# Patient Record
Sex: Female | Born: 1955 | Race: Black or African American | Hispanic: No | Marital: Married | Smoking: Never smoker
Health system: Southern US, Community
[De-identification: ages and names within clinical notes are randomized; demographics above are authoritative.]

## PROBLEM LIST (undated history)

## (undated) DIAGNOSIS — D689 Coagulation defect, unspecified: Secondary | ICD-10-CM

## (undated) DIAGNOSIS — Z862 Personal history of diseases of the blood and blood-forming organs and certain disorders involving the immune mechanism: Secondary | ICD-10-CM

## (undated) DIAGNOSIS — Z5189 Encounter for other specified aftercare: Secondary | ICD-10-CM

## (undated) DIAGNOSIS — E785 Hyperlipidemia, unspecified: Secondary | ICD-10-CM

## (undated) DIAGNOSIS — T7840XA Allergy, unspecified, initial encounter: Secondary | ICD-10-CM

## (undated) DIAGNOSIS — I639 Cerebral infarction, unspecified: Secondary | ICD-10-CM

## (undated) HISTORY — PX: ABDOMINAL HYSTERECTOMY: SHX81

## (undated) HISTORY — DX: Coagulation defect, unspecified: D68.9

## (undated) HISTORY — DX: Encounter for other specified aftercare: Z51.89

## (undated) HISTORY — DX: Personal history of diseases of the blood and blood-forming organs and certain disorders involving the immune mechanism: Z86.2

## (undated) HISTORY — DX: Allergy, unspecified, initial encounter: T78.40XA

## (undated) HISTORY — DX: Hyperlipidemia, unspecified: E78.5

## (undated) HISTORY — PX: LASER ABLATION OF THE CERVIX: SHX1949

---
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
## 1982-06-21 HISTORY — PX: NOSE SURGERY: SHX723

## 1983-06-22 HISTORY — PX: TUBAL LIGATION: SHX77

## 1984-06-21 DIAGNOSIS — Z862 Personal history of diseases of the blood and blood-forming organs and certain disorders involving the immune mechanism: Secondary | ICD-10-CM

## 1984-06-21 HISTORY — DX: Personal history of diseases of the blood and blood-forming organs and certain disorders involving the immune mechanism: Z86.2

## 2010-02-13 ENCOUNTER — Inpatient Hospital Stay: Payer: Self-pay | Admitting: Internal Medicine

## 2011-07-04 ENCOUNTER — Emergency Department: Payer: Self-pay | Admitting: *Deleted

## 2011-07-07 ENCOUNTER — Emergency Department: Payer: Self-pay | Admitting: Unknown Physician Specialty

## 2011-07-07 LAB — COMPREHENSIVE METABOLIC PANEL
Albumin: 4.3 g/dL (ref 3.4–5.0)
Anion Gap: 10 (ref 7–16)
BUN: 21 mg/dL — ABNORMAL HIGH (ref 7–18)
EGFR (Non-African Amer.): >60
Glucose: 80 mg/dL (ref 65–99)
Osmolality: 289 (ref 275–301)
Potassium: 4.1 mmol/L (ref 3.5–5.1)
SGPT (ALT): 27 U/L
Sodium: 144 mmol/L (ref 136–145)
Total Protein: 7.6 g/dL (ref 6.4–8.2)

## 2011-07-07 LAB — CBC
HCT: 37.2 % (ref 35.0–47.0)
MCHC: 33.8 g/dL (ref 32.0–36.0)
Platelet: 200 10*3/uL (ref 150–440)
RBC: 4.24 10*6/uL (ref 3.80–5.20)
RDW: 12 % (ref 11.5–14.5)
WBC: 2.8 10*3/uL — ABNORMAL LOW (ref 3.6–11.0)

## 2012-01-11 ENCOUNTER — Ambulatory Visit: Payer: Self-pay

## 2018-05-23 ENCOUNTER — Ambulatory Visit: Payer: Self-pay

## 2018-05-30 ENCOUNTER — Ambulatory Visit: Payer: Self-pay

## 2018-06-01 ENCOUNTER — Ambulatory Visit: Payer: Self-pay | Admitting: Adult Health Nurse Practitioner

## 2018-06-01 DIAGNOSIS — D689 Coagulation defect, unspecified: Secondary | ICD-10-CM | POA: Insufficient documentation

## 2018-06-01 DIAGNOSIS — Z0000 Encounter for general adult medical examination without abnormal findings: Secondary | ICD-10-CM | POA: Insufficient documentation

## 2018-06-01 NOTE — Progress Notes (Signed)
  Patient: Nancy ReekDesiree Murray Female    DOB: 1956/04/09   62 y.o.   MRN: [id number] Visit Date: 06/01/2018  Today's Provider: Jacelyn Pieah Doles-Johnson, NP   Chief Complaint  Patient presents with  . Health Maintenance    ITP - bruise on back of neck and shoulders   . Eye Problem    Shadows in R eye that sometimes feels like foreign body  . Abdominal Pain    Sharp pain after eating and unusual deposits in BM   Subjective:    HPI   Pt states that it has been 6-7 years since she has seen a medical provider.   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  Pt states that she has purple marks on her neck that started a year ago and has grown in size since it started.  No other episodes or abnormal bleeding or bruising.   A few days ago pt states that she ate cloves (states they help to get rid of parasites) and the pains went away. States that she thinks she may have had worms. And saw them in the toilet on BM today.  Denies any hematochezia or melena.      Allergies  Allergen Reactions  . Cefizox [Ceftizoxime] Hives and Shortness Of Breath  . Biaxin [Clarithromycin] Hives   Previous Medications   No medications on file    Review of Systems  All other systems reviewed and are negative.   Social History   Tobacco Use  . Smoking status: Never Smoker  . Smokeless tobacco: Never Used  Substance Use Topics  . Alcohol use: Not on file   Objective:   BP (!) 136/95   Pulse 61   Temp 98.4 F (36.9 C)   Ht 5' 5.35" (1.66 m)   Wt 154 lb 12.8 oz (70.2 kg)   BMI 25.48 kg/m   Physical Exam Vitals signs reviewed.  Constitutional:      Appearance: Normal appearance.  HENT:     Head: Normocephalic and atraumatic.  Eyes:     General: Lids are normal.        Right eye: No foreign body.     Extraocular Movements: Extraocular movements intact.     Conjunctiva/sclera: Conjunctivae normal.  Neck:   Musculoskeletal: Normal range of motion and neck supple.  Cardiovascular:     Rate and Rhythm: Normal rate and regular rhythm.  Pulmonary:     Effort: Pulmonary effort is normal.     Breath sounds: Normal breath sounds.  Abdominal:     General: Bowel sounds are normal.     Palpations: Abdomen is soft.  Musculoskeletal:        General: No swelling.  Skin:    General: Skin is warm and dry.  Neurological:     Mental Status: She is alert and oriented to person, place, and time.         Assessment & Plan:        Referral to eye clinic. Rinse eye with OTC eye drops.    Will test stool if BMs continue to be abnormal.   Monitor BP at next OV- if still elevated consider medications.   Routine labs ordered tonight.   ITP- check platelet count.    FU in 4 weeks for lab review.         Jacelyn Pieah Doles-Johnson, NP   [facility]

## 2018-06-02 LAB — LIPID PANEL
Chol/HDL Ratio: 3.7 ratio (ref 0.0–4.4)
Cholesterol, Total: 280 mg/dL — ABNORMAL HIGH (ref 100–199)
HDL: 75 mg/dL (ref >39)
LDL Calculated: 190 mg/dL — ABNORMAL HIGH (ref 0–99)
Triglycerides: 75 mg/dL (ref 0–149)
VLDL Cholesterol Cal: 15 mg/dL (ref 5–40)

## 2018-06-02 LAB — CBC
Hematocrit: 39.7 % (ref 34.0–46.6)
Hemoglobin: 13.3 g/dL (ref 11.1–15.9)
MCH: 29.2 pg (ref 26.6–33.0)
MCHC: 33.5 g/dL (ref 31.5–35.7)
MCV: 87 fL (ref 79–97)
Platelets: 244 10*3/uL (ref 150–450)
RBC: 4.56 x10E6/uL (ref 3.77–5.28)
RDW: 13.1 % (ref 12.3–15.4)
WBC: 4.2 10*3/uL (ref 3.4–10.8)

## 2018-06-02 LAB — COMPREHENSIVE METABOLIC PANEL
A/G RATIO: 2 (ref 1.2–2.2)
ALT: 12 IU/L (ref 0–32)
AST: 20 IU/L (ref 0–40)
Albumin: 4.9 g/dL — ABNORMAL HIGH (ref 3.6–4.8)
Alkaline Phosphatase: 115 IU/L (ref 39–117)
BILIRUBIN TOTAL: 0.4 mg/dL (ref 0.0–1.2)
BUN/Creatinine Ratio: 24 (ref 12–28)
BUN: 24 mg/dL (ref 8–27)
CHLORIDE: 100 mmol/L (ref 96–106)
CO2: 23 mmol/L (ref 20–29)
Calcium: 10.4 mg/dL — ABNORMAL HIGH (ref 8.7–10.3)
Creatinine, Ser: 1.02 mg/dL — ABNORMAL HIGH (ref 0.57–1.00)
GFR calc Af Amer: 68 mL/min/{1.73_m2} (ref >59)
GFR calc non Af Amer: 59 mL/min/{1.73_m2} — ABNORMAL LOW (ref >59)
Globulin, Total: 2.5 g/dL (ref 1.5–4.5)
Glucose: 81 mg/dL (ref 65–99)
Potassium: 4.5 mmol/L (ref 3.5–5.2)
Sodium: 140 mmol/L (ref 134–144)
Total Protein: 7.4 g/dL (ref 6.0–8.5)

## 2018-06-02 LAB — HEMOGLOBIN A1C
ESTIMATED AVERAGE GLUCOSE: 117 mg/dL
Hgb A1c MFr Bld: 5.7 % — ABNORMAL HIGH (ref 4.8–5.6)

## 2018-06-02 LAB — TSH: TSH: 0.958 u[IU]/mL (ref 0.450–4.500)

## 2018-07-04 ENCOUNTER — Ambulatory Visit: Payer: Self-pay | Admitting: Family Medicine

## 2018-07-04 VITALS — BP 117/85 | HR 57 | Temp 98.0°F | Ht 65.0 in | Wt 154.1 lb

## 2018-07-04 DIAGNOSIS — E785 Hyperlipidemia, unspecified: Secondary | ICD-10-CM

## 2018-07-04 DIAGNOSIS — R238 Other skin changes: Principal | ICD-10-CM

## 2018-07-04 DIAGNOSIS — M79605 Pain in left leg: Secondary | ICD-10-CM

## 2018-07-04 DIAGNOSIS — R7303 Prediabetes: Secondary | ICD-10-CM

## 2018-07-04 DIAGNOSIS — E782 Mixed hyperlipidemia: Secondary | ICD-10-CM

## 2018-07-04 DIAGNOSIS — Z1231 Encounter for screening mammogram for malignant neoplasm of breast: Secondary | ICD-10-CM

## 2018-07-04 DIAGNOSIS — D699 Hemorrhagic condition, unspecified: Secondary | ICD-10-CM

## 2018-07-04 HISTORY — DX: Hyperlipidemia, unspecified: E78.5

## 2018-07-04 NOTE — Assessment & Plan Note (Signed)
Barely in the prediabetes range; avoid whites (flour, rice, sugar, etc); monitor every 6 months

## 2018-07-04 NOTE — Progress Notes (Signed)
BP 117/85 (BP Location: Left Arm, Patient Position: Sitting, Cuff Size: Normal)   Pulse (!) 57   Temp 98 F (36.7 C) (Oral)   Ht _0  (1.651 m)   Wt 154 lb 1.6 oz (69.9 kg)   LMP 07/04/2005 (Within Months)   BMI 25.64 kg/m    Subjective:    Patient ID: Nancy Murray, female    DOB: 1956/06/07, 63 y.o.   MRN: [id number]  HPI: Nancy Murray is a 63 y.o. female  Chief Complaint  Patient presents with  . Follow-up    No new health concerns; concerned about hands/feet being really cold for extended period after being outside    HPI Here for regular f/u No medical excitement  When she is outside, hands and feet get icy cold when she was being outside for a while No change in color No pain She does have a hx of anemia; no extra fatigue Sister had hypothyroidism; obese, diabetes, HTN No significant weight gain, back and forth between 10 pounds No constipation now  Hx of ITP; she hasn't seen a hematologist for many years; over 15 years ago; everything was fine for the most part she says; they checked her platelets and they were low and started her on prednisone, 15 years ago; hospitalized for a few weeks; took prednisone for 6-8 weeks; not sure if any DEXA  She was referred to the eye clinic  Mildly reduced GFR, no fam hx of kidney disease in the family; good water drinker  High cholesterol; LDL was 190 last month; mother had heart failure; maternal uncle had a stroke High before, responded to weight loss; patient is not interested in statins; she is wondering what she can eat  The 10-year ASCVD risk score Mikey Bussing DC Brooke Bonito., et al., 2013) is: 6.5%   Values used to calculate the score:     Age: 13 years     Sex: Female     Is Non-Hispanic African American: Yes     Diabetic: No     Tobacco smoker: No     Systolic Blood Pressure: 909 mmHg     Is BP treated: No     HDL Cholesterol: 75 mg/dL     Total Cholesterol: 280 mg/dL  Prediabetes; drink herbal tea; not drinking  sodas  Was carrying heavy pocketbook over shoulders; darker pigmentation versus bruising on the shoulder; no soreness; no nosebleeds or gum bleeding; no abnormal bleeding in urine or stool; going on for 10 months  Pains in the legs, numbness, then that went away; wonders about tick bites  No flowsheet data found. No flowsheet data found.  Relevant past medical, surgical, family and social history reviewed Past Medical History:  Diagnosis Date  . Allergy   . Blood transfusion without reported diagnosis   . Clotting disorder ([place])   . History of ITP 1986  . Hyperlipidemia 07/04/2018   Past Surgical History:  Procedure Laterality Date  . ABDOMINAL HYSTERECTOMY    . CESAREAN SECTION    . LASER ABLATION OF THE CERVIX    . NOSE SURGERY     No family history on file. Social History   Tobacco Use  . Smoking status: Never Smoker  . Smokeless tobacco: Never Used  Substance Use Topics  . Alcohol use: Not on file  . Drug use: Not on file     Interim medical history since last visit reviewed. Allergies and medications reviewed  Review of Systems Per HPI unless specifically indicated above  Objective:    BP 117/85 (BP Location: Left Arm, Patient Position: Sitting, Cuff Size: Normal)   Pulse (!) 57   Temp 98 F (36.7 C) (Oral)   Ht _0  (1.651 m)   Wt 154 lb 1.6 oz (69.9 kg)   LMP 07/04/2005 (Within Months)   BMI 25.64 kg/m   Wt Readings from Last 3 Encounters:  07/04/18 154 lb 1.6 oz (69.9 kg)  06/01/18 154 lb 12.8 oz (70.2 kg)    Physical Exam Constitutional:      General: She is not in acute distress.    Appearance: She is well-developed. She is not diaphoretic.  HENT:     Head: Normocephalic and atraumatic.  Eyes:     General: No scleral icterus. Neck:     Thyroid: No thyromegaly.  Cardiovascular:     Rate and Rhythm: Normal rate and regular rhythm.     Heart sounds: Normal heart sounds. No murmur.  Pulmonary:     Effort: Pulmonary effort is normal. No  respiratory distress.     Breath sounds: Normal breath sounds. No wheezing.  Abdominal:     General: There is no distension.  Skin:    General: Skin is warm and dry.     Coloration: Skin is not pale.          Comments: Hyperpigmentation versus ecchymotic changes over both traps and nape of neck; no thickening or velvety texture typical of acanthosis nigricans  Neurological:     Mental Status: She is alert.  Psychiatric:        Mood and Affect: Mood is not anxious or depressed.        Behavior: Behavior normal.        Thought Content: Thought content normal.        Judgment: Judgment normal.     Results for orders placed or performed in visit on 06/01/18  CBC  Result Value Ref Range   WBC 4.2 3.4 - 10.8 x10E3/uL   RBC 4.56 3.77 - 5.28 x10E6/uL   Hemoglobin 13.3 11.1 - 15.9 g/dL   Hematocrit 39.7 34.0 - 46.6 %   MCV 87 79 - 97 fL   MCH 29.2 26.6 - 33.0 pg   MCHC 33.5 31.5 - 35.7 g/dL   RDW 13.1 12.3 - 15.4 %   Platelets 244 150 - 450 x10E3/uL  Comp Met (CMET)  Result Value Ref Range   Glucose 81 65 - 99 mg/dL   BUN 24 8 - 27 mg/dL   Creatinine, Ser 1.02 (H) 0.57 - 1.00 mg/dL   GFR calc non Af Amer 59 (L) >59 mL/min/1.73   GFR calc Af Amer 68 >59 mL/min/1.73   BUN/Creatinine Ratio 24 12 - 28   Sodium 140 134 - 144 mmol/L   Potassium 4.5 3.5 - 5.2 mmol/L   Chloride 100 96 - 106 mmol/L   CO2 23 20 - 29 mmol/L   Calcium 10.4 (H) 8.7 - 10.3 mg/dL   Total Protein 7.4 6.0 - 8.5 g/dL   Albumin 4.9 (H) 3.6 - 4.8 g/dL   Globulin, Total 2.5 1.5 - 4.5 g/dL   Albumin/Globulin Ratio 2.0 1.2 - 2.2   Bilirubin Total 0.4 0.0 - 1.2 mg/dL   Alkaline Phosphatase 115 39 - 117 IU/L   AST 20 0 - 40 IU/L   ALT 12 0 - 32 IU/L  Lipid Profile  Result Value Ref Range   Cholesterol, Total 280 (H) 100 - 199 mg/dL   Triglycerides 75 0 -  149 mg/dL   HDL 75 >39 mg/dL   VLDL Cholesterol Cal 15 5 - 40 mg/dL   LDL Calculated 190 (H) 0 - 99 mg/dL   Comment: Comment    Chol/HDL Ratio 3.7 0.0 -  4.4 ratio  HgB A1c  Result Value Ref Range   Hgb A1c MFr Bld 5.7 (H) 4.8 - 5.6 %   Est. average glucose Bld gHb Est-mCnc 117 mg/dL  TSH  Result Value Ref Range   TSH 0.958 0.450 - 4.500 uIU/mL      Assessment & Plan:   Problem List Items Addressed This Visit      Other   Prediabetes    Barely in the prediabetes range; avoid whites (flour, rice, sugar, etc); monitor every 6 months      Hyperlipidemia    Risk of heart attack and stroke, 10 year risk is 6.5%; patient does not want a statin but would like to try diet first; avoid saturated fats, more plant-based and whole grains; recheck lipids in 8 weeks       Other Visit Diagnoses    Other skin changes    -  Primary   Relevant Orders   Ambulatory referral to Dermatology   B. burgdorfi antibodies   ANA,IFA RA Diag Pnl w/rflx Tit/Patn   Pain of left lower extremity       Relevant Orders   B. burgdorfi antibodies   ANA,IFA RA Diag Pnl w/rflx Tit/Patn   Bleeding disorder ([place])       Relevant Orders   PTT   Protime-INR   Breast cancer screening by mammogram       Relevant Orders   MM 3D SCREEN BREAST BILATERAL       Follow up plan: Return in about 8 weeks (around 08/29/2018) for labs, then provider 1-2 weeks later.  An after-visit summary was printed and given to the patient at [place].  Please see the patient instructions which may contain other information and recommendations beyond what is mentioned above in the assessment and plan.  No orders of the defined types were placed in this encounter.   Orders Placed This Encounter  Procedures  . MM 3D SCREEN BREAST BILATERAL  . PTT  . Protime-INR  . B. burgdorfi antibodies  . ANA,IFA RA Diag Pnl w/rflx Tit/Patn  . Ambulatory referral to Dermatology

## 2018-07-04 NOTE — Patient Instructions (Addendum)
Try to limit saturated fats in your diet (bologna, hot dogs, barbeque, cheeseburgers, hamburgers, steak, bacon, sausage, cheese, etc.) and get more fresh fruits, vegetables, and whole grains  Return for labs in 8 weeks and then follow-up with provider afterwards to discuss   Cholesterol Cholesterol is a white, waxy, fat-like substance that is needed by the human body in small amounts. The liver makes all the cholesterol we need. Cholesterol is carried from the liver by the blood through the blood vessels. Deposits of cholesterol (plaques) may build up on blood vessel (artery) walls. Plaques make the arteries narrower and stiffer. Cholesterol plaques increase the risk for heart attack and stroke. You cannot feel your cholesterol level even if it is very high. The only way to know that it is high is to have a blood test. Once you know your cholesterol levels, you should keep a record of the test results. Work with your health care provider to keep your levels in the desired range. What do the results mean?  Total cholesterol is a rough measure of all the cholesterol in your blood.  LDL (low-density lipoprotein) is the "bad" cholesterol. This is the type that causes plaque to build up on the artery walls. You want this level to be low.  HDL (high-density lipoprotein) is the "good" cholesterol because it cleans the arteries and carries the LDL away. You want this level to be high.  Triglycerides are fat that the body can either burn for energy or store. High levels are closely linked to heart disease. What are the desired levels of cholesterol?  Total cholesterol below 200.  LDL below 100 for people who are at risk, below 70 for people at very high risk.  HDL above 40 is good. A level of 60 or higher is considered to be protective against heart disease.  Triglycerides below 150. How can I lower my cholesterol? Diet Follow your diet program as told by your health care provider.  Choose  fish or white meat chicken and [place], roasted or baked. Limit fatty cuts of red meat, fried foods, and processed meats, such as sausage and lunch meats.  Eat lots of fresh fruits and vegetables.  Choose whole grains, beans, pasta, potatoes, and cereals.  Choose olive oil, corn oil, or canola oil, and use only small amounts.  Avoid butter, mayonnaise, shortening, or palm kernel oils.  Avoid foods with trans fats.  Drink skim or nonfat milk and eat low-fat or nonfat yogurt and cheeses. Avoid whole milk, cream, ice cream, egg yolks, and full-fat cheeses.  Healthier desserts include angel food cake, ginger snaps, animal crackers, hard candy, popsicles, and low-fat or nonfat frozen yogurt. Avoid pastries, cakes, pies, and cookies.  Exercise  Follow your exercise program as told by your health care provider. A regular program: ? Helps to decrease LDL and raise HDL. ? Helps with weight control.  Do things that increase your activity level, such as gardening, walking, and taking the stairs.  Ask your health care provider about ways that you can be more active in your daily life. Medicine  Take over-the-counter and prescription medicines only as told by your health care provider. ? Medicine may be prescribed by your health care provider to help lower cholesterol and decrease the risk for heart disease. This is usually done if diet and exercise have failed to bring down cholesterol levels. ? If you have several risk factors, you may need medicine even if your levels are normal. This information is not intended  to replace advice given to you by your health care provider. Make sure you discuss any questions you have with your health care provider. Document Released: 03/02/2001 Document Revised: 01/03/2016 Document Reviewed: 12/06/2015 Elsevier Interactive Patient Education  2019 Elsevier Inc.  Preventing High Cholesterol Cholesterol is a waxy, fat-like substance that your body needs in small  amounts. Your liver makes all the cholesterol that your body needs. Having high cholesterol (hypercholesterolemia) increases your risk for heart disease and stroke. Extra (excess) cholesterol comes from the food you eat, such as animal-based fat (saturated fat) from meat and some dairy products. High cholesterol can often be prevented with diet and lifestyle changes. If you already have high cholesterol, you can control it with diet and lifestyle changes, as well as medicine. What nutrition changes can be made?  Eat less saturated fat. Foods that contain saturated fat include red meat and some dairy products.  Avoid processed meats, like bacon and lunch meats.  Avoid trans fats, which are found in margarine and some baked goods.  Avoid foods and beverages that have added sugars.  Eat more fruits, vegetables, and whole grains.  Choose healthy sources of protein, such as fish, poultry, and nuts.  Choose healthy sources of fat, such as: ? Nuts. ? Vegetable oils, especially olive oil. ? Fish that have healthy fats (omega-3 fatty acids), such as mackerel or salmon. What lifestyle changes can be made?   Lose weight if you are overweight. Losing 5-10 lb (2.3-4.5 kg) can help prevent or control high cholesterol and reduce your risk for diabetes and high blood pressure. Ask your health care provider to help you with a diet and exercise plan to safely lose weight.  Get enough exercise. Do at least 150 minutes of moderate-intensity exercise each week. ? You could do this in short exercise sessions several times a day, or you could do longer exercise sessions a few times a week. For example, you could take a brisk 10-minute walk or bike ride, 3 times a day, for 5 days a week.  Do not smoke. If you need help quitting, ask your health care provider.  Limit your alcohol intake. If you drink alcohol, limit alcohol intake to no more than 1 drink a day for nonpregnant women and 2 drinks a day for men.  One drink equals 12 oz of beer, 5 oz of wine, or 1 oz of hard liquor. Why are these changes important?  If you have high cholesterol, deposits (plaques) may build up on the walls of your blood vessels. Plaques make the arteries narrower and stiffer, which can restrict or block blood flow and cause blood clots to form. This greatly increases your risk for heart attack and stroke. Making diet and lifestyle changes can reduce your risk for these life-threatening conditions. What can I do to lower my risk?  Manage your risk factors for high cholesterol. Talk with your health care provider about all of your risk factors and how to lower your risk.  Manage other conditions that you have, such as diabetes or high blood pressure (hypertension).  Have your cholesterol checked at regular intervals.  Keep all follow-up visits as told by your health care provider. This is important. How is this treated? In addition to diet and lifestyle changes, your health care provider may recommend medicines to help lower cholesterol, such as a medicine to reduce the amount of cholesterol made in your liver. You may need medicine if:  Diet and lifestyle changes do not lower your  cholesterol enough.  You have high cholesterol and other risk factors for heart disease or stroke. Take over-the-counter and prescription medicines only as told by your health care provider. Where to find more information  American Heart Association: 1122334455www.heart.org/HEARTORG/Conditions/Cholesterol/Cholesterol_UCM_001089_SubHomePage.jsp  National Heart, Lung, and Blood Institute: http://hood.com/www.nhlbi.nih.gov/health/resources/heart/heart-cholesterol-hbc-what-html Summary  High cholesterol increases your risk for heart disease and stroke. By keeping your cholesterol level low, you can reduce your risk for these conditions.  Diet and lifestyle changes are the most important steps in preventing high cholesterol.  Work with your health care provider to  manage your risk factors, and have your blood tested regularly. This information is not intended to replace advice given to you by your health care provider. Make sure you discuss any questions you have with your health care provider. Document Released: 06/22/2015 Document Revised: 02/14/2016 Document Reviewed: 02/14/2016 Elsevier Interactive Patient Education  2019 Elsevier Inc.  Prediabetes Eating Plan Prediabetes is a condition that causes blood sugar (glucose) levels to be higher than normal. This increases the risk for developing diabetes. In order to prevent diabetes from developing, your health care provider may recommend a diet and other lifestyle changes to help you:  Control your blood glucose levels.  Improve your cholesterol levels.  Manage your blood pressure. Your health care provider may recommend working with a diet and nutrition specialist (dietitian) to make a meal plan that is best for you. What are tips for following this plan? Lifestyle  Set weight loss goals with the help of your health care team. It is recommended that most people with prediabetes lose 7% of their current body weight.  Exercise for at least 30 minutes at least 5 days a week.  Attend a support group or seek ongoing support from a mental health counselor.  Take over-the-counter and prescription medicines only as told by your health care provider. Reading food labels  Read food labels to check the amount of fat, salt (sodium), and sugar in prepackaged foods. Avoid foods that have: ? Saturated fats. ? Trans fats. ? Added sugars.  Avoid foods that have more than 300 milligrams (mg) of sodium per serving. Limit your daily sodium intake to less than 2,300 mg each day. Shopping  Avoid buying pre-made and processed foods. Cooking  Cook with olive oil. Do not use butter, lard, or ghee.  Bake, broil, grill, or boil foods. Avoid frying. Meal planning   Work with your dietitian to develop an  eating plan that is right for you. This may include: ? Tracking how many calories you take in. Use a food diary, notebook, or mobile application to track what you eat at each meal. ? Using the glycemic index (GI) to plan your meals. The index tells you how quickly a food will raise your blood glucose. Choose low-GI foods. These foods take a longer time to raise blood glucose.  Consider following a Mediterranean diet. This diet includes: ? Several servings each day of fresh fruits and vegetables. ? Eating fish at least twice a week. ? Several servings each day of whole grains, beans, nuts, and seeds. ? Using olive oil instead of other fats. ? Moderate alcohol consumption. ? Eating small amounts of red meat and whole-fat dairy.  If you have high blood pressure, you may need to limit your sodium intake or follow a diet such as the DASH eating plan. DASH is an eating plan that aims to lower high blood pressure. What foods are recommended? The items listed below may not be a complete list.  Talk with your dietitian about what dietary choices are best for you. Grains Whole grains, such as whole-wheat or whole-grain breads, crackers, cereals, and pasta. Unsweetened oatmeal. Bulgur. Barley. Quinoa. Brown rice. Corn or whole-wheat flour tortillas or taco shells. Vegetables Lettuce. Spinach. Peas. Beets. Cauliflower. Cabbage. Broccoli. Carrots. Tomatoes. Squash. Eggplant. Herbs. Peppers. Onions. Cucumbers. Brussels sprouts. Fruits Berries. Bananas. Apples. Oranges. Grapes. Papaya. Mango. Pomegranate. Kiwi. Grapefruit. Cherries. Meats and other protein foods Seafood. Poultry without skin. Lean cuts of pork and beef. Tofu. Eggs. Nuts. Beans. Dairy Low-fat or fat-free dairy products, such as yogurt, cottage cheese, and cheese. Beverages Water. Tea. Coffee. Sugar-free or diet soda. Seltzer water. Lowfat or no-fat milk. Milk alternatives, such as soy or almond milk. Fats and oils Olive oil. Canola oil.  Sunflower oil. Grapeseed oil. Avocado. Walnuts. Sweets and desserts Sugar-free or low-fat pudding. Sugar-free or low-fat ice cream and other frozen treats. Seasoning and other foods Herbs. Sodium-free spices. Mustard. Relish. Low-fat, low-sugar ketchup. Low-fat, low-sugar barbecue sauce. Low-fat or fat-free mayonnaise. What foods are not recommended? The items listed below may not be a complete list. Talk with your dietitian about what dietary choices are best for you. Grains Refined white flour and flour products, such as bread, pasta, snack foods, and cereals. Vegetables Canned vegetables. Frozen vegetables with butter or cream sauce. Fruits Fruits canned with syrup. Meats and other protein foods Fatty cuts of meat. Poultry with skin. Breaded or fried meat. Processed meats. Dairy Full-fat yogurt, cheese, or milk. Beverages Sweetened drinks, such as sweet iced tea and soda. Fats and oils Butter. Lard. Ghee. Sweets and desserts Baked goods, such as cake, cupcakes, pastries, cookies, and cheesecake. Seasoning and other foods Spice mixes with added salt. Ketchup. Barbecue sauce. Mayonnaise. Summary  To prevent diabetes from developing, you may need to make diet and other lifestyle changes to help control blood sugar, improve cholesterol levels, and manage your blood pressure.  Set weight loss goals with the help of your health care team. It is recommended that most people with prediabetes lose 7 percent of their current body weight.  Consider following a Mediterranean diet that includes plenty of fresh fruits and vegetables, whole grains, beans, nuts, seeds, fish, lean meat, low-fat dairy, and healthy oils. This information is not intended to replace advice given to you by your health care provider. Make sure you discuss any questions you have with your health care provider. Document Released: 10/22/2014 Document Revised: 08/11/2016 Document Reviewed: 08/11/2016 Elsevier Interactive  Patient Education  2019 [employer].

## 2018-07-04 NOTE — Assessment & Plan Note (Signed)
Risk of heart attack and stroke, 10 year risk is 6.5%; patient does not want a statin but would like to try diet first; avoid saturated fats, more plant-based and whole grains; recheck lipids in 8 weeks

## 2018-07-06 ENCOUNTER — Ambulatory Visit: Payer: Self-pay | Admitting: Ophthalmology

## 2018-07-06 LAB — B. BURGDORFI ANTIBODIES: Lyme IgG/IgM Ab: <0.91 {ISR} (ref 0.00–0.90)

## 2018-07-06 LAB — PROTIME-INR
INR: 1.1 (ref 0.8–1.2)
Prothrombin Time: 11.2 s (ref 9.1–12.0)

## 2018-07-06 LAB — APTT: APTT: 28 s (ref 24–33)

## 2018-07-06 LAB — ANA,IFA RA DIAG PNL W/RFLX TIT/PATN
ANA Titer 1: NEGATIVE
Cyclic Citrullin Peptide Ab: 6 units (ref 0–19)
Rheumatoid fact SerPl-aCnc: <10 IU/mL (ref 0.0–13.9)

## 2018-08-29 ENCOUNTER — Other Ambulatory Visit: Payer: Self-pay

## 2018-08-29 DIAGNOSIS — E782 Mixed hyperlipidemia: Secondary | ICD-10-CM

## 2018-08-30 LAB — COMPREHENSIVE METABOLIC PANEL
ALT: 18 IU/L (ref 0–32)
AST: 19 IU/L (ref 0–40)
Albumin/Globulin Ratio: 2 (ref 1.2–2.2)
Albumin: 4.4 g/dL (ref 3.8–4.8)
Alkaline Phosphatase: 112 IU/L (ref 39–117)
BUN / CREAT RATIO: 26 (ref 12–28)
BUN: 25 mg/dL (ref 8–27)
Bilirubin Total: 0.2 mg/dL (ref 0.0–1.2)
CALCIUM: 9.8 mg/dL (ref 8.7–10.3)
CO2: 22 mmol/L (ref 20–29)
Chloride: 102 mmol/L (ref 96–106)
Creatinine, Ser: 0.96 mg/dL (ref 0.57–1.00)
GFR calc Af Amer: 73 mL/min/{1.73_m2} (ref >59)
GFR calc non Af Amer: 63 mL/min/{1.73_m2} (ref >59)
GLOBULIN, TOTAL: 2.2 g/dL (ref 1.5–4.5)
Glucose: 95 mg/dL (ref 65–99)
Potassium: 4.3 mmol/L (ref 3.5–5.2)
Sodium: 138 mmol/L (ref 134–144)
Total Protein: 6.6 g/dL (ref 6.0–8.5)

## 2018-08-30 LAB — HEMOGLOBIN A1C
Est. average glucose Bld gHb Est-mCnc: 120 mg/dL
Hgb A1c MFr Bld: 5.8 % — ABNORMAL HIGH (ref 4.8–5.6)

## 2018-09-05 ENCOUNTER — Ambulatory Visit: Payer: Self-pay

## 2018-09-06 ENCOUNTER — Encounter (INDEPENDENT_AMBULATORY_CARE_PROVIDER_SITE_OTHER): Payer: Self-pay

## 2018-09-07 ENCOUNTER — Other Ambulatory Visit: Payer: Self-pay

## 2018-09-07 ENCOUNTER — Ambulatory Visit: Payer: Self-pay | Admitting: Adult Health Nurse Practitioner

## 2018-09-07 VITALS — BP 122/88 | HR 66 | Temp 98.1°F | Ht 61.42 in | Wt 155.9 lb

## 2018-09-07 DIAGNOSIS — E782 Mixed hyperlipidemia: Secondary | ICD-10-CM

## 2018-09-07 DIAGNOSIS — R7303 Prediabetes: Secondary | ICD-10-CM

## 2018-09-07 NOTE — Progress Notes (Signed)
  Patient: Nancy Murray Female    DOB: Sep 01, 1955   63 y.o.   MRN: [id number] Visit Date: 09/07/2018  Today's Provider: [facility]   Chief Complaint  Patient presents with  . Follow-up    lab results    Subjective:    HPI   Reviewed lab results.   Allergies  Allergen Reactions  . Cefizox [Ceftizoxime] Hives and Shortness Of Breath  . Biaxin [Clarithromycin] Hives   Previous Medications   No medications on file    Review of Systems  Social History   Tobacco Use  . Smoking status: Never Smoker  . Smokeless tobacco: Never Used  Substance Use Topics  . Alcohol use: Not on file   Objective:   BP 122/88 (BP Location: Left Arm, Patient Position: Sitting, Cuff Size: Normal)   Pulse 66   Temp 98.1 F (36.7 C)   Ht 5' 1.42" (1.56 m)   Wt 155 lb 14.4 oz (70.7 kg)   LMP 07/04/2005 (Within Months)   BMI 29.06 kg/m   Physical Exam Vitals signs reviewed.  Constitutional:      Appearance: Normal appearance.  Cardiovascular:     Rate and Rhythm: Normal rate and regular rhythm.     Heart sounds: Normal heart sounds.  Pulmonary:     Effort: Pulmonary effort is normal.     Breath sounds: Normal breath sounds.  Abdominal:     General: Bowel sounds are normal.     Palpations: Abdomen is soft.  Neurological:     Mental Status: She is alert.         Assessment & Plan:         PreDM/HLD:  Encouraged healthy lifestyle modifications.   Labs reviewed.  FU in 6 months.   [facility]   [facility]

## 2019-03-01 ENCOUNTER — Other Ambulatory Visit: Payer: Self-pay

## 2019-03-01 DIAGNOSIS — E782 Mixed hyperlipidemia: Secondary | ICD-10-CM

## 2019-03-01 DIAGNOSIS — R7303 Prediabetes: Secondary | ICD-10-CM

## 2019-03-02 LAB — BASIC METABOLIC PANEL
BUN/Creatinine Ratio: 22 (ref 12–28)
BUN: 21 mg/dL (ref 8–27)
CO2: 24 mmol/L (ref 20–29)
Calcium: 9.9 mg/dL (ref 8.7–10.3)
Chloride: 101 mmol/L (ref 96–106)
Creatinine, Ser: 0.97 mg/dL (ref 0.57–1.00)
GFR calc Af Amer: 71 mL/min/{1.73_m2} (ref >59)
GFR calc non Af Amer: 62 mL/min/{1.73_m2} (ref >59)
Glucose: 80 mg/dL (ref 65–99)
Potassium: 4.5 mmol/L (ref 3.5–5.2)
Sodium: 136 mmol/L (ref 134–144)

## 2019-03-02 LAB — LIPID PANEL
Chol/HDL Ratio: 4 ratio (ref 0.0–4.4)
Cholesterol, Total: 255 mg/dL — ABNORMAL HIGH (ref 100–199)
HDL: 64 mg/dL (ref >39)
LDL Chol Calc (NIH): 176 mg/dL — ABNORMAL HIGH (ref 0–99)
Triglycerides: 87 mg/dL (ref 0–149)
VLDL Cholesterol Cal: 15 mg/dL (ref 5–40)

## 2019-03-02 LAB — HEMOGLOBIN A1C
Est. average glucose Bld gHb Est-mCnc: 114 mg/dL
Hgb A1c MFr Bld: 5.6 % (ref 4.8–5.6)

## 2019-03-08 ENCOUNTER — Other Ambulatory Visit: Payer: Self-pay

## 2019-03-08 ENCOUNTER — Ambulatory Visit: Payer: Self-pay | Admitting: Gerontology

## 2019-03-15 ENCOUNTER — Other Ambulatory Visit: Payer: Self-pay | Admitting: Gerontology

## 2019-03-15 DIAGNOSIS — E782 Mixed hyperlipidemia: Secondary | ICD-10-CM

## 2019-03-18 ENCOUNTER — Inpatient Hospital Stay
Admission: EM | Admit: 2019-03-18 | Discharge: 2019-03-19 | DRG: 064 | Disposition: A | Discharge status: Home / Self Care | Payer: Self-pay | Attending: Internal Medicine | Admitting: Internal Medicine

## 2019-03-18 ENCOUNTER — Other Ambulatory Visit: Payer: Self-pay

## 2019-03-18 ENCOUNTER — Emergency Department: Payer: Self-pay

## 2019-03-18 ENCOUNTER — Inpatient Hospital Stay (HOSPITAL_COMMUNITY)
Admit: 2019-03-18 | Discharge: 2019-03-18 | Disposition: A | Discharge status: Home / Self Care | Payer: Self-pay | Attending: Internal Medicine | Admitting: Internal Medicine

## 2019-03-18 ENCOUNTER — Inpatient Hospital Stay: Payer: Self-pay

## 2019-03-18 DIAGNOSIS — Z20828 Contact with and (suspected) exposure to other viral communicable diseases: Secondary | ICD-10-CM | POA: Diagnosis present

## 2019-03-18 DIAGNOSIS — R29701 NIHSS score 1: Secondary | ICD-10-CM | POA: Diagnosis present

## 2019-03-18 DIAGNOSIS — R200 Anesthesia of skin: Secondary | ICD-10-CM | POA: Diagnosis present

## 2019-03-18 DIAGNOSIS — R4781 Slurred speech: Secondary | ICD-10-CM | POA: Diagnosis present

## 2019-03-18 DIAGNOSIS — E785 Hyperlipidemia, unspecified: Secondary | ICD-10-CM | POA: Diagnosis present

## 2019-03-18 DIAGNOSIS — I340 Nonrheumatic mitral (valve) insufficiency: Secondary | ICD-10-CM

## 2019-03-18 DIAGNOSIS — I611 Nontraumatic intracerebral hemorrhage in hemisphere, cortical: Secondary | ICD-10-CM | POA: Diagnosis present

## 2019-03-18 DIAGNOSIS — Z862 Personal history of diseases of the blood and blood-forming organs and certain disorders involving the immune mechanism: Secondary | ICD-10-CM

## 2019-03-18 DIAGNOSIS — Z90710 Acquired absence of both cervix and uterus: Secondary | ICD-10-CM

## 2019-03-18 DIAGNOSIS — I639 Cerebral infarction, unspecified: Principal | ICD-10-CM | POA: Diagnosis present

## 2019-03-18 DIAGNOSIS — I361 Nonrheumatic tricuspid (valve) insufficiency: Secondary | ICD-10-CM

## 2019-03-18 DIAGNOSIS — G8321 Monoplegia of upper limb affecting right dominant side: Secondary | ICD-10-CM | POA: Diagnosis present

## 2019-03-18 DIAGNOSIS — Z881 Allergy status to other antibiotic agents status: Secondary | ICD-10-CM

## 2019-03-18 HISTORY — DX: Cerebral infarction, unspecified: I63.9

## 2019-03-18 LAB — DIFFERENTIAL
Abs Immature Granulocytes: 0.01 10*3/uL (ref 0.00–0.07)
Basophils Absolute: 0 10*3/uL (ref 0.0–0.1)
Basophils Relative: 1 %
Eosinophils Absolute: 0.3 10*3/uL (ref 0.0–0.5)
Eosinophils Relative: 11 %
Immature Granulocytes: 0 %
Lymphocytes Relative: 41 %
Lymphs Abs: 1.2 10*3/uL (ref 0.7–4.0)
Monocytes Absolute: 0.4 10*3/uL (ref 0.1–1.0)
Monocytes Relative: 12 %
Neutro Abs: 1 10*3/uL — ABNORMAL LOW (ref 1.7–7.7)
Neutrophils Relative %: 35 %

## 2019-03-18 LAB — COMPREHENSIVE METABOLIC PANEL
ALT: 25 U/L (ref 0–44)
AST: 27 U/L (ref 15–41)
Albumin: 4.7 g/dL (ref 3.5–5.0)
Alkaline Phosphatase: 100 U/L (ref 38–126)
Anion gap: 9 (ref 5–15)
BUN: 16 mg/dL (ref 8–23)
CO2: 23 mmol/L (ref 22–32)
Calcium: 9.8 mg/dL (ref 8.9–10.3)
Chloride: 104 mmol/L (ref 98–111)
Creatinine, Ser: 0.88 mg/dL (ref 0.44–1.00)
GFR calc Af Amer: >60 mL/min (ref >60)
GFR calc non Af Amer: >60 mL/min (ref >60)
Glucose, Bld: 111 mg/dL — ABNORMAL HIGH (ref 70–99)
Potassium: 4 mmol/L (ref 3.5–5.1)
Sodium: 136 mmol/L (ref 135–145)
Total Bilirubin: 1 mg/dL (ref 0.3–1.2)
Total Protein: 7.6 g/dL (ref 6.5–8.1)

## 2019-03-18 LAB — CBC
HCT: 39 % (ref 36.0–46.0)
Hemoglobin: 13.1 g/dL (ref 12.0–15.0)
MCH: 28.8 pg (ref 26.0–34.0)
MCHC: 33.6 g/dL (ref 30.0–36.0)
MCV: 85.7 fL (ref 80.0–100.0)
Platelets: 210 10*3/uL (ref 150–400)
RBC: 4.55 MIL/uL (ref 3.87–5.11)
RDW: 14.3 % (ref 11.5–15.5)
WBC: 3 10*3/uL — ABNORMAL LOW (ref 4.0–10.5)
nRBC: 0 % (ref 0.0–0.2)

## 2019-03-18 LAB — PROTIME-INR
INR: 1 (ref 0.8–1.2)
Prothrombin Time: 13 seconds (ref 11.4–15.2)

## 2019-03-18 LAB — APTT: aPTT: 26 seconds (ref 24–36)

## 2019-03-18 LAB — SARS CORONAVIRUS 2 (TAT 6-24 HRS): SARS Coronavirus 2: NEGATIVE

## 2019-03-18 MED ORDER — ACETAMINOPHEN 325 MG PO TABS: 650.0000 mg | ORAL_TABLET | ORAL | Status: DC | PRN

## 2019-03-18 MED ORDER — SENNOSIDES-DOCUSATE SODIUM 8.6-50 MG PO TABS: 1.0000 | ORAL_TABLET | Freq: Every evening | ORAL | Status: DC | PRN

## 2019-03-18 MED ORDER — ATORVASTATIN CALCIUM 20 MG PO TABS
40.0000 mg | ORAL_TABLET | Freq: Every day | ORAL | Status: DC
  Administered 2019-03-18: 19:00:00 40 mg via ORAL
  Filled 2019-03-18: qty 2

## 2019-03-18 MED ORDER — STROKE: EARLY STAGES OF RECOVERY BOOK
Freq: Once | Status: AC
  Administered 2019-03-18: 19:00:00

## 2019-03-18 MED ORDER — LABETALOL HCL 5 MG/ML IV SOLN: 10.0000 mg | INTRAVENOUS | Status: DC | PRN

## 2019-03-18 MED ORDER — INFLUENZA VAC SPLIT QUAD 0.5 ML IM SUSY: 0.5000 mL | PREFILLED_SYRINGE | INTRAMUSCULAR | Status: DC

## 2019-03-18 MED ORDER — SODIUM CHLORIDE 0.9% FLUSH: 3.0000 mL | Freq: Once | INTRAVENOUS | Status: DC

## 2019-03-18 MED ORDER — ASPIRIN 81 MG PO CHEW
81.0000 mg | CHEWABLE_TABLET | Freq: Once | ORAL | Status: AC
  Administered 2019-03-18: 16:00:00 81 mg via ORAL
  Filled 2019-03-18: qty 1

## 2019-03-18 MED ORDER — ACETAMINOPHEN 650 MG RE SUPP: 650.0000 mg | RECTAL | Status: DC | PRN

## 2019-03-18 MED ORDER — SODIUM CHLORIDE 0.9% FLUSH
3.0000 mL | Freq: Two times a day (BID) | INTRAVENOUS | Status: DC
  Administered 2019-03-18 – 2019-03-19 (×2): 3 mL via INTRAVENOUS

## 2019-03-18 MED ORDER — ACETAMINOPHEN 160 MG/5ML PO SOLN
650.0000 mg | ORAL | Status: DC | PRN
  Filled 2019-03-18: qty 20.3

## 2019-03-18 NOTE — H&P (Signed)
[employer] [facility] [place] [facility]   PATIENT NAME: Nancy Murray    MR#:  [id number]  DATE OF BIRTH:  Feb 17, 1956  DATE OF ADMISSION:  03/18/2019  PRIMARY CARE PHYSICIAN: Rolm Gala, NP   REQUESTING/REFERRING PHYSICIAN: Artis Delay  CHIEF COMPLAINT:   Chief Complaint  Patient presents with   Weakness    HISTORY OF PRESENT ILLNESS:  Nancy Murray  is a 63 y.o. female with a known history of ITP and hyperlipidemia who presented with complaints of right-sided numbness and weakness in the right upper extremity over the last 3 days.  Developed some slurred speech yesterday.  Still has some residual right-sided weakness in the right upper extremity at this time.  Denies any weakness in the right lower extremity.  Had some difficulty holding objects and writing.  No chest pain.  No fevers.  No headaches.  Patient was evaluated in the emergency room and patient was already outside window for TPA.  CT scan of the head without contrast revealed no acute intracranial abnormality.  Patient subsequently had MRI of the brain done which revealed small areas of acute infarct in the left posterior frontal and parietal lobe. Small area of hemorrhage in the left parietal operculum infarct. No mass-effect or midline shift. No significant chronic ischemia. Emergency room physician discussed case with neurosurgeon on-call Dr. Adelene Idler and she stated there was nothing to do from neurosurgery standpoint and recommended neurology consultation.  Medical service called to admit patient for further evaluation and management.  PAST MEDICAL HISTORY:   Past Medical History:  Diagnosis Date   Allergy    Blood transfusion without reported diagnosis    Clotting disorder (HCC)    History of ITP 1986   Hyperlipidemia 07/04/2018   Stroke (HCC) 03/18/2019    PAST SURGICAL HISTORY:   Past Surgical History:  Procedure Laterality Date   ABDOMINAL HYSTERECTOMY     CESAREAN  SECTION     LASER ABLATION OF THE CERVIX     NOSE SURGERY      SOCIAL HISTORY:   Social History   Tobacco Use   Smoking status: Never Smoker   Smokeless tobacco: Never Used  Substance Use Topics   Alcohol use: Not on file    FAMILY HISTORY:  History reviewed. No pertinent family history.  DRUG ALLERGIES:   Allergies  Allergen Reactions   Cefizox [Ceftizoxime] Hives and Shortness Of Breath   Biaxin [Clarithromycin] Hives    REVIEW OF SYSTEMS:   Review of Systems  Constitutional: Negative for chills and fever.  HENT: Negative for hearing loss and tinnitus.   Eyes: Negative for blurred vision and double vision.  Respiratory: Negative for cough and hemoptysis.   Cardiovascular: Negative for chest pain and palpitations.  Gastrointestinal: Negative for heartburn and nausea.  Genitourinary: Negative for dysuria and frequency.  Musculoskeletal: Negative for myalgias and neck pain.  Skin: Negative for itching.  Neurological: Negative for dizziness.       Right upper extremity weakness and numbness. Has some slurred speech but denies any difficulty with swallowing.  Psychiatric/Behavioral: Negative for depression and hallucinations.    MEDICATIONS AT HOME:   Prior to Admission medications   Not on File      VITAL SIGNS:  Blood pressure (!) 179/88, pulse 62, resp. rate 19, weight 66.7 kg, last menstrual period 07/04/2005, SpO2 98 %.  PHYSICAL EXAMINATION:  Physical Exam  GENERAL:  63 y.o.-year-old patient lying in the bed with no acute distress.  EYES: Pupils  equal, round, reactive to light and accommodation. No scleral icterus. Extraocular muscles intact.  HEENT: Head atraumatic, normocephalic. Oropharynx and nasopharynx clear.  NECK:  Supple, no jugular venous distention. No thyroid enlargement, no tenderness.  LUNGS: Normal breath sounds bilaterally, no wheezing, rales,rhonchi or crepitation. No use of accessory muscles of respiration.  CARDIOVASCULAR:  S1, S2 normal. No murmurs, rubs, or gallops.  ABDOMEN: Soft, nontender, nondistended. Bowel sounds present. No organomegaly or mass.  EXTREMITIES: No cyanosis or clubbing or edema..  .  NEUROLOGIC:  Strength of 4/5 in right upper extremity.  5/5 in all extremities.  Slightly decreased sensation in right upper extremity. Gait not checked.  PSYCHIATRIC: The patient is alert and oriented x 3.  SKIN: No obvious rash, lesion, or ulcer.   LABORATORY PANEL:   CBC Recent Labs  Lab 03/18/19 1158  WBC 3.0*  HGB 13.1  HCT 39.0  PLT 210   ------------------------------------------------------------------------------------------------------------------  Chemistries  Recent Labs  Lab 03/18/19 1158  NA 136  K 4.0  CL 104  CO2 23  GLUCOSE 111*  BUN 16  CREATININE 0.88  CALCIUM 9.8  AST 27  ALT 25  ALKPHOS 100  BILITOT 1.0   ------------------------------------------------------------------------------------------------------------------  Cardiac Enzymes No results for input(s): TROPONINI in the last 168 hours. ------------------------------------------------------------------------------------------------------------------  RADIOLOGY:  Ct Head Wo Contrast  Result Date: 03/18/2019 CLINICAL DATA:  Neurological deficits. Possible subacute stroke. Right arm numbness and weakness for several days. Slurred speech yesterday. EXAM: CT HEAD WITHOUT CONTRAST TECHNIQUE: Contiguous axial images were obtained from the base of the skull through the vertex without intravenous contrast. COMPARISON:  None. FINDINGS: Brain: No subdural, epidural, or subarachnoid hemorrhage identified. Cerebellum, brainstem, and basal cisterns are normal. Ventricles and sulci are unremarkable. No acute cortical ischemia or infarct. No mass effect or midline shift. Vascular: No hyperdense vessel or unexpected calcification. Skull: Normal. Negative for fracture or focal lesion. Sinuses/Orbits: No acute finding. Other: None.  IMPRESSION: No acute intracranial abnormalities are identified to explain the patient's symptoms. Electronically Signed   By: Dorise Bullion III M.D   On: 03/18/2019 13:15   Mr Brain Wo Contrast  Result Date: 03/18/2019 CLINICAL DATA:  Stroke. Right arm numbness and weakness several days. Slurred speech. EXAM: MRI HEAD WITHOUT CONTRAST TECHNIQUE: Multiplanar, multiecho pulse sequences of the brain and surrounding structures were obtained without intravenous contrast. COMPARISON:  CT head 03/18/2019 FINDINGS: Brain: Small area of acute infarct in the left posterior frontal cortex. Small areas of acute infarct in the right parietal cortex. Small area of acute infarct with hemorrhage in the left parietal operculum. Ventricle size normal. No significant chronic ischemia. No mass or edema. Vascular: Normal arterial flow voids. Skull and upper cervical spine: Negative Sinuses/Orbits: Moderate mucosal edema paranasal sinuses. Normal orbit Other: None IMPRESSION: Small areas of acute infarct in the left posterior frontal and parietal lobe. Small area of hemorrhage in the left parietal operculum infarct. No mass-effect or midline shift. No significant chronic ischemia. Electronically Signed   By: Franchot Gallo M.D.   On: 03/18/2019 14:39      IMPRESSION AND PLAN:  Patient is a 63 year old female with history of ITP and hyperlipidemia being admitted for evaluation of acute CVA  1.  Acute CVA with small hemorrhagic conversion. Symptoms of right upper extremity weakness and numbness with some slurred speech started about 3 days prior to admission.  Patient was outside window for TPA. Noted to have left frontal and parietal lobe infarction with small hemorrhage in the left parietal operculum  which was seen on MRI of the brain but not seen on initial CT head however. I discussed case with neurologist on-call Dr. Thad Rangereynolds who will see patient in consultation.  She recommended giving a one-time dose of 81 mg  aspirin and to repeat CT head without contrast in a.m. before making further management decision. Further work-up for CVA including 2D echocardiogram and carotid Doppler ultrasound ordered. Speech therapy, occupational therapist and physical therapy consult placed. Placed on high intensity statins with Lipitor Allow for permissive hypertension in the setting of acute CVA  2.  History of ITP Platelet count stable at 210.  3.  Hyperlipidemia Placed on statins.  Lipid panel in a.m.  DVT prophylaxis; SCDs for now. Avoiding heparin products due to concern for hemorrhagic conversion of CVA on MRI brain.  All the records are reviewed and case discussed with ED provider. Management plans discussed with the patient, family and they are in agreement. Updated husband present at bedside on treatment plans as outlined.  All questions were answered  CODE STATUS: Full code  TOTAL TIME TAKING CARE OF THIS PATIENT: 62 minutes.    Isami Mehra M.D on 03/18/2019 at 4:09 PM  Between 7am to 6pm - Pager - [phone]  After 6pm go to [email or web address] - Social research officer, [email or web address] [email or web address]  [facility] [place] [facility]  Office  [phone]  CC: Primary care physician; Rolm GalaIloabachie, Chioma E, NP   Note: This dictation was prepared with Dragon dictation along with smaller phrase technology. Any transcriptional errors that result from this process are unintentional.

## 2019-03-18 NOTE — ED Triage Notes (Signed)
Pt presents via POV c/o right arm numbness/weakness for several days. Also reports slurred speech yesterday. Report hyperlipidemia per pt.

## 2019-03-18 NOTE — Progress Notes (Signed)
Pt admitted from ED with new stroke. Reports only deficit is fine motor skills of fingers such as holding pen, writing, ets. NIH-0. VSS. Stroke education started. Reports limited ability to purchase meds.

## 2019-03-18 NOTE — ED Provider Notes (Signed)
[facility] Emergency Department Provider Note  ____________________________________________   First MD Initiated Contact with Patient 03/18/19 1203     (approximate)  I have reviewed the triage vital signs and the nursing notes.   HISTORY  Chief Complaint Weakness      HPI Nancy Murray is a 63 y.o. female with prior ITP, HLD who presents with right arm numbness and weakness for several days and then developed slurred speech yesterday.  Patient is at the right arm weakness has been going on for the past few days, constant, gradually worsening, nothing makes it better, nothing makes it worse.  She had difficulty writing and holding objects.  She had an episode of some slurred speech yesterday though that since resolved.    Past Medical History:  Diagnosis Date  . Allergy   . Blood transfusion without reported diagnosis   . Clotting disorder (HCC)   . History of ITP 1986  . Hyperlipidemia 07/04/2018    Patient Active Problem List   Diagnosis Date Noted  . Hyperlipidemia 07/04/2018  . Prediabetes 07/04/2018  . Clotting disorder (HCC) 06/01/2018  . Healthcare maintenance 06/01/2018    Past Surgical History:  Procedure Laterality Date  . ABDOMINAL HYSTERECTOMY    . CESAREAN SECTION    . LASER ABLATION OF THE CERVIX    . NOSE SURGERY      Prior to Admission medications   Not on File    Allergies Cefizox [ceftizoxime] and Biaxin [clarithromycin]  History reviewed. No pertinent family history.  Social History Social History   Tobacco Use  . Smoking status: Never Smoker  . Smokeless tobacco: Never Used  Substance Use Topics  . Alcohol use: Not on file  . Drug use: Not on file      Review of Systems Constitutional: No fever/chills Eyes: No visual changes. ENT: No sore throat. Cardiovascular: Denies chest pain. Respiratory: Denies shortness of breath. Gastrointestinal: No abdominal pain.  No nausea, no vomiting.  No diarrhea.   No constipation. Genitourinary: Negative for dysuria. Musculoskeletal: Negative for back pain. Skin: Negative for rash. Neurological: Positive arm weakness, positive changes in speech All other ROS negative ____________________________________________   PHYSICAL EXAM:  VITAL SIGNS: Blood pressure (!) 164/133, pulse (!) 56, resp. rate 17, weight 66.7 kg, last menstrual period 07/04/2005, SpO2 99 %.  Constitutional: Alert and oriented. Well appearing and in no acute distress. Eyes: Conjunctivae are normal. EOMI. Head: Atraumatic. Nose: No congestion/rhinnorhea. Mouth/Throat: Mucous membranes are moist.   Neck: No stridor. Trachea Midline. FROM Cardiovascular: Normal rate, regular rhythm. Grossly normal heart sounds.  Good peripheral circulation. Respiratory: Normal respiratory effort.  No retractions. Lungs CTAB. Gastrointestinal: Soft and nontender. No distention. No abdominal bruits.  Musculoskeletal: No lower extremity tenderness nor edema.  No joint effusions. Neurologic:  Normal speech and language.  Mild weakness in the right arm with mild pronator drift.  Sensation intact.  Leg strength equal. Skin:  Skin is warm, dry and intact. No rash noted. Psychiatric: Mood and affect are normal. Speech and behavior are normal. GU: Deferred   ____________________________________________   LABS (all labs ordered are listed, but only abnormal results are displayed)  Labs Reviewed  CBC - Abnormal; Notable for the following components:      Result Value   WBC 3.0 (*)    All other components within normal limits  DIFFERENTIAL - Abnormal; Notable for the following components:   Neutro Abs 1.0 (*)    All other components within normal limits  COMPREHENSIVE  METABOLIC PANEL - Abnormal; Notable for the following components:   Glucose, Bld 111 (*)    All other components within normal limits  PROTIME-INR  APTT   ____________________________________________   ED ECG REPORT I, Vanessa [place], the attending physician, personally viewed and interpreted this ECG.  EKG is sinus bradycardia rate of 59, no ST elevation, T wave inversion in 2, 3, aVF, V5 V6 ____________________________________________  RADIOLOGY I, Vanessa , personally viewed and evaluated these images (plain radiographs) as part of my medical decision making, as well as reviewing the written report by the radiologist.  ED MD interpretation: CT head without evidence of hematoma  Official radiology report(s): Ct Head Wo Contrast  Result Date: 03/18/2019 CLINICAL DATA:  Neurological deficits. Possible subacute stroke. Right arm numbness and weakness for several days. Slurred speech yesterday. EXAM: CT HEAD WITHOUT CONTRAST TECHNIQUE: Contiguous axial images were obtained from the base of the skull through the vertex without intravenous contrast. COMPARISON:  None. FINDINGS: Brain: No subdural, epidural, or subarachnoid hemorrhage identified. Cerebellum, brainstem, and basal cisterns are normal. Ventricles and sulci are unremarkable. No acute cortical ischemia or infarct. No mass effect or midline shift. Vascular: No hyperdense vessel or unexpected calcification. Skull: Normal. Negative for fracture or focal lesion. Sinuses/Orbits: No acute finding. Other: None. IMPRESSION: No acute intracranial abnormalities are identified to explain the patient's symptoms. Electronically Signed   By: Dorise Bullion III M.D   On: 03/18/2019 13:15   Mr Brain Wo Contrast  Result Date: 03/18/2019 CLINICAL DATA:  Stroke. Right arm numbness and weakness several days. Slurred speech. EXAM: MRI HEAD WITHOUT CONTRAST TECHNIQUE: Multiplanar, multiecho pulse sequences of the brain and surrounding structures were obtained without intravenous contrast. COMPARISON:  CT head 03/18/2019 FINDINGS: Brain: Small area of acute infarct in the left posterior frontal cortex. Small areas of acute infarct in the right parietal cortex. Small area of acute  infarct with hemorrhage in the left parietal operculum. Ventricle size normal. No significant chronic ischemia. No mass or edema. Vascular: Normal arterial flow voids. Skull and upper cervical spine: Negative Sinuses/Orbits: Moderate mucosal edema paranasal sinuses. Normal orbit Other: None IMPRESSION: Small areas of acute infarct in the left posterior frontal and parietal lobe. Small area of hemorrhage in the left parietal operculum infarct. No mass-effect or midline shift. No significant chronic ischemia. Electronically Signed   By: Franchot Gallo M.D.   On: 03/18/2019 14:39    ____________________________________________   PROCEDURES  Procedure(s) performed (including Critical Care):  Procedures   ____________________________________________   INITIAL IMPRESSION / ASSESSMENT AND PLAN / ED COURSE  Taleyah Hillman was evaluated in Emergency Department on 03/18/2019 for the symptoms described in the history of present illness. She was evaluated in the context of the global COVID-19 pandemic, which necessitated consideration that the patient might be at risk for infection with the SARS-CoV-2 virus that causes COVID-19. Institutional protocols and algorithms that pertain to the evaluation of patients at risk for COVID-19 are in a state of rapid change based on information released by regulatory bodies including the CDC and federal and state organizations. These policies and algorithms were followed during the patient's care in the ED.    CT head was negative  Labs are unremarkable  MRI  Small areas of acute infarct in the left posterior frontal and  parietal lobe. Small area of hemorrhage in the left parietal  operculum infarct. No mass-effect or midline shift. No significant  chronic ischemia.      Discuss with Medicine,  recommend to clear with NS.  D/w Meyran  From NS, and discussed with neurology.  The medicine team discussed with neurology and they are okay with giving 81 mg of  aspirin and having a repeat CT head tomorrow.    Discussed with hospital team and they will admit patient.   ____________________________________________   FINAL CLINICAL IMPRESSION(S) / ED DIAGNOSES   Final diagnoses:  Cerebrovascular accident (CVA), unspecified mechanism (HCC)      MEDICATIONS GIVEN DURING THIS VISIT:  Medications  sodium chloride flush (NS) 0.9 % injection 3 mL (3 mLs Intravenous Not Given 03/18/19 1426)  aspirin chewable tablet 81 mg (has no administration in time range)     ED Discharge Orders    None       Note:  This document was prepared using Dragon voice recognition software and may include unintentional dictation errors.   Concha SeFunke, Khira Cudmore E, MD 03/18/19 [phone]

## 2019-03-18 NOTE — ED Notes (Signed)
Patient transported to CT 

## 2019-03-18 NOTE — ED Notes (Signed)
Called on call neuro surgery  [phone]

## 2019-03-19 ENCOUNTER — Inpatient Hospital Stay: Payer: Self-pay

## 2019-03-19 DIAGNOSIS — Z862 Personal history of diseases of the blood and blood-forming organs and certain disorders involving the immune mechanism: Secondary | ICD-10-CM

## 2019-03-19 DIAGNOSIS — I639 Cerebral infarction, unspecified: Principal | ICD-10-CM

## 2019-03-19 LAB — LIPID PANEL
Cholesterol: 228 mg/dL — ABNORMAL HIGH (ref 0–200)
HDL: 56 mg/dL (ref >40)
LDL Cholesterol: 157 mg/dL — ABNORMAL HIGH (ref 0–99)
Total CHOL/HDL Ratio: 4.1 RATIO
Triglycerides: 75 mg/dL (ref <150)
VLDL: 15 mg/dL (ref 0–40)

## 2019-03-19 LAB — CBC
HCT: 36.6 % (ref 36.0–46.0)
Hemoglobin: 12 g/dL (ref 12.0–15.0)
MCH: 28.5 pg (ref 26.0–34.0)
MCHC: 32.8 g/dL (ref 30.0–36.0)
MCV: 86.9 fL (ref 80.0–100.0)
Platelets: 188 10*3/uL (ref 150–400)
RBC: 4.21 MIL/uL (ref 3.87–5.11)
RDW: 14.4 % (ref 11.5–15.5)
WBC: 3.7 10*3/uL — ABNORMAL LOW (ref 4.0–10.5)
nRBC: 0 % (ref 0.0–0.2)

## 2019-03-19 LAB — BASIC METABOLIC PANEL
Anion gap: 7 (ref 5–15)
BUN: 22 mg/dL (ref 8–23)
CO2: 27 mmol/L (ref 22–32)
Calcium: 9.5 mg/dL (ref 8.9–10.3)
Chloride: 107 mmol/L (ref 98–111)
Creatinine, Ser: 1.05 mg/dL — ABNORMAL HIGH (ref 0.44–1.00)
GFR calc Af Amer: >60 mL/min (ref >60)
GFR calc non Af Amer: 56 mL/min — ABNORMAL LOW (ref >60)
Glucose, Bld: 100 mg/dL — ABNORMAL HIGH (ref 70–99)
Potassium: 4.1 mmol/L (ref 3.5–5.1)
Sodium: 141 mmol/L (ref 135–145)

## 2019-03-19 LAB — HEMOGLOBIN A1C
Hgb A1c MFr Bld: 5.8 % — ABNORMAL HIGH (ref 4.8–5.6)
Mean Plasma Glucose: 119.76 mg/dL

## 2019-03-19 LAB — MAGNESIUM: Magnesium: 2.3 mg/dL (ref 1.7–2.4)

## 2019-03-19 LAB — ECHOCARDIOGRAM COMPLETE
Height: 65 in
Weight: 2403.2 oz

## 2019-03-19 MED ORDER — ATORVASTATIN CALCIUM 40 MG PO TABS: 40.0000 mg | ORAL_TABLET | Freq: Every day | ORAL | 0 refills | Status: DC

## 2019-03-19 MED ORDER — IOHEXOL 350 MG/ML SOLN
75.0000 mL | Freq: Once | INTRAVENOUS | Status: AC | PRN
  Administered 2019-03-19: 75 mL via INTRAVENOUS

## 2019-03-19 MED ORDER — CLOPIDOGREL BISULFATE 75 MG PO TABS: 75.0000 mg | ORAL_TABLET | Freq: Every day | ORAL | 0 refills | Status: DC

## 2019-03-19 MED ORDER — ASPIRIN EC 81 MG PO TBEC: 81.0000 mg | DELAYED_RELEASE_TABLET | Freq: Every day | ORAL | 0 refills | Status: DC

## 2019-03-19 NOTE — Evaluation (Signed)
Occupational Therapy Evaluation Patient Details Name: Nancy Murray MRN: [id number] DOB: 19-Jul-1955 Today's Date: 03/19/2019    History of Present Illness Pt is a 63 y/o F admitted for CVA affecting L posterior frontal lobe and R parietal lobe. C/o R arm numbness and tingling and slurred speech. PMH includes ITP and HLD.   Clinical Impression   [name] was seen for OT evaluation this date. Prior to hospital admission, pt was independent in all aspects of ADL/IADL and denies falls history in past 12 months. Pt lives with her spouse in a 1 level apartment home with an elevator to enter. Currently pt reporting symptoms have resolved. Pt demonstrates baseline independence to perform ADL and mobility tasks and no sensory, coordination, cognitive, or visual deficits appreciated with assessment. Pt reports that she continues to experience some weakness and decreased fine motor coordination in her RUE, however, this is much improved from her time of presentation to the hospital and does not appear to impact her ability to complete BADL/IADL tasks at or near her baseline level of independents. Pt encouraged to seek outpatient occupational therapy if hand function does not improved or she is not satisfied with her ability to complete her daily routines due to this RUE weakness. No further skilled OT needs identified. Will sign off. Please re-consult if additional OT needs arise during this admission.    Follow Up Recommendations  No OT follow up    Equipment Recommendations  None recommended by OT    Recommendations for Other Services       Precautions / Restrictions Precautions Precautions: None Restrictions Weight Bearing Restrictions: No      Mobility Bed Mobility Overal bed mobility: Independent             General bed mobility comments: Pt standing in room and seated EOB following session  Transfers Overall transfer level: Independent Equipment used: None              General transfer comment: Pt performs sit to stand independently without AD    Balance Overall balance assessment: Independent;No apparent balance deficits (not formally assessed)(Steady sitting, reaching, within BOS. Good static and dynamic balance t/o evaluation.)                                         ADL either performed or assessed with clinical judgement   ADL Overall ADL's : At baseline                                       General ADL Comments: Pt reporting feeling back to baseline status for ADL mgt. Some decreased RUE [place], but is able to manage all ADLs independently.     Vision Baseline Vision/History: Wears glasses Wears Glasses: Reading only Patient Visual Report: Other (comment)(Pt states she has noted some difficulty with eye-hand coordination. Particularly when writing. Otherwise vision is baseline.)       Perception     Praxis      Pertinent Vitals/Pain Pain Assessment: No/denies pain     Hand Dominance Right   Extremity/Trunk Assessment Upper Extremity Assessment Upper Extremity Assessment: Overall WFL for tasks assessed;RUE deficits/detail RUE Deficits / Details: Pt continues to report deficits with strength and [place] with her RUE. BUE Grossly WNL. 4+-5/5 throughout. Thumb opposition slowed in RUE compared to LUE  but WFL. Pt completes clock draw in under 60 seconds with RUE. RUE Sensation: WNL RUE Coordination: decreased fine motor   Lower Extremity Assessment Lower Extremity Assessment: Overall WFL for tasks assessed;Defer to PT evaluation   Cervical / Trunk Assessment Cervical / Trunk Assessment: Normal   Communication Communication Communication: No difficulties   Cognition Arousal/Alertness: Awake/alert Behavior During Therapy: WFL for tasks assessed/performed Overall Cognitive Status: Within Functional Limits for tasks assessed                                     General Comments        Exercises Other Exercises Other Exercises: Pt instructed in BE FAST stroke signs and symptoms recognition and response education. Pt also educated on role of OT in acute care versus outpatient setting. Encouraged to continue to monitor RUE for functional limitations and speak to her primary care provider if interested in seeking outpatient occupational therapy for hand function.   Shoulder Instructions      Home Living Family/patient expects to be discharged to:: Private residence Living Arrangements: Spouse/significant other;Children Available Help at Discharge: Family Type of Home: Apartment Home Access: Elevator     Home Layout: One level     Bathroom Shower/Tub: IT trainer: Handicapped height     Home Equipment: None   Additional Comments: Pt independent without AD's      Prior Functioning/Environment Level of Independence: Independent        Comments: Pt able to perform all ADL's/IADLs independently.        OT Problem List: Decreased strength;Decreased coordination;Impaired UE functional use;Decreased knowledge of use of DME or AE;Decreased safety awareness      OT Treatment/Interventions:      OT Goals(Current goals can be found in the care plan section) Acute Rehab OT Goals Patient Stated Goal: to return home OT Goal Formulation: All assessment and education complete, DC therapy Time For Goal Achievement: 03/19/19 Potential to Achieve Goals: Good  OT Frequency:     Barriers to D/C:            Co-evaluation              AM-PAC OT "6 Clicks" Daily Activity     Outcome Measure Help from another person eating meals?: None Help from another person taking care of personal grooming?: None Help from another person toileting, which includes using toliet, bedpan, or urinal?: None Help from another person bathing (including washing, rinsing, drying)?: None Help from another person to put on and taking off regular upper  body clothing?: None Help from another person to put on and taking off regular lower body clothing?: None 6 Click Score: 24   End of Session Equipment Utilized During Treatment: Gait belt  Activity Tolerance: Patient tolerated treatment well Patient left: in chair;with chair alarm set;with call bell/phone within reach  OT Visit Diagnosis: Other symptoms and signs involving the nervous system (R29.898)                Time: 5366-4403 OT Time Calculation (min): 28 min Charges:  OT General Charges $OT Visit: 1 Visit OT Evaluation $OT Eval Low Complexity: 1 Low OT Treatments $Self Care/Home Management : 8-22 mins  Rockney Ghee, M.S., OTR/L Ascom: [phone] 03/19/19, 1:35 PM

## 2019-03-19 NOTE — Evaluation (Signed)
Physical Therapy Evaluation Patient Details Name: Nancy Murray MRN: [id number] DOB: [birth year] Today's Date: 03/19/2019   History of Present Illness  Pt is a 63 y/o F admitted for CVA affecting L posterior frontal lobe and R parietal lobe. C/o R arm numbness and tingling and slurred speech. PMH includes ITP and HLD.  Clinical Impression  Pt is a pleasant 63 year old F who was admitted for CVA. Upon entry, pt is standing in the room trying to change her gown. Pt reports no pain, but feels a bit weak from being less active since hospitalization. Pt performs bed mobility, transfers, and ambulation independently with supervision from PT. R UE coordination mildly affected, but will not interfere with ability to perform ADL's. Pt does not require follow-up PT due to adequate performance in mobility and strength assessment as well as ideal caregiver support and home environment.      Follow Up Recommendations No PT follow up    Equipment Recommendations  None recommended by PT    Recommendations for Other Services       Precautions / Restrictions Precautions Precautions: None Restrictions Weight Bearing Restrictions: No      Mobility  Bed Mobility               General bed mobility comments: Pt standing in room and seated EOB following session  Transfers Overall transfer level: Independent Equipment used: None             General transfer comment: Pt performs sit to stand independently without AD  Ambulation/Gait Ambulation/Gait assistance: Independent Gait Distance (Feet): 180 Feet Assistive device: None Gait Pattern/deviations: WFL(Within Functional Limits) Gait velocity: 2.0 ft/sec Gait velocity interpretation: 1.31 - 2.62 ft/sec, indicative of limited community ambulator General Gait Details: Pt amb with WNL gait pattern; supervision provided by PT, but not required   Stairs            Wheelchair Mobility    Modified Rankin (Stroke Patients Only)        Balance Overall balance assessment: Independent(able to maintain upright posture w/o UE support)                                           Pertinent Vitals/Pain Pain Assessment: No/denies pain    Home Living Family/patient expects to be discharged to:: Private residence Living Arrangements: Spouse/significant other;Children Available Help at Discharge: Family Type of Home: Apartment Home Access: Elevator     Home Layout: One level Home Equipment: None Additional Comments: Pt independent without AD's    Prior Function Level of Independence: Independent         Comments: Pt able to perform all ADL's and driving independently without use of AD.     Hand Dominance        Extremity/Trunk Assessment   Upper Extremity Assessment Upper Extremity Assessment: RUE deficits/detail RUE Deficits / Details: No significant deficit in strength compared to L UE RUE Sensation: WNL RUE Coordination: decreased fine motor(slighly slowed F->N R UE)    Lower Extremity Assessment Lower Extremity Assessment: Overall WFL for tasks assessed       Communication   Communication: No difficulties  Cognition Arousal/Alertness: Awake/alert Behavior During Therapy: WFL for tasks assessed/performed Overall Cognitive Status: Within Functional Limits for tasks assessed  General Comments      Exercises     Assessment/Plan    PT Assessment Patent does not need any further PT services  PT Problem List         PT Treatment Interventions      PT Goals (Current goals can be found in the Care Plan section)  Acute Rehab PT Goals Patient Stated Goal: to return home PT Goal Formulation: With patient Time For Goal Achievement: 04/02/19 Potential to Achieve Goals: Good    Frequency     Barriers to discharge        Co-evaluation               AM-PAC PT "6 Clicks" Mobility  Outcome Measure Help  needed turning from your back to your side while in a flat bed without using bedrails?: None Help needed moving from lying on your back to sitting on the side of a flat bed without using bedrails?: None Help needed moving to and from a bed to a chair (including a wheelchair)?: None Help needed standing up from a chair using your arms (e.g., wheelchair or bedside chair)?: None Help needed to walk in hospital room?: None Help needed climbing 3-5 steps with a railing? : None 6 Click Score: 24    End of Session Equipment Utilized During Treatment: Gait belt Activity Tolerance: Patient tolerated treatment well Patient left: in bed(seated at EOB) Nurse Communication: Mobility status PT Visit Diagnosis: Other symptoms and signs involving the nervous system (R29.898)    Time: 3710-6269 PT Time Calculation (min) (ACUTE ONLY): 14 min   Charges:              Nancy Murray, SPT   Nancy Murray 03/19/2019, 12:51 PM

## 2019-03-19 NOTE — Discharge Summary (Signed)
[facility]   PATIENT NAME: Nancy Murray    MR#:  [id number]  DATE OF BIRTH:  1955-06-24  DATE OF ADMISSION:  03/18/2019   ADMITTING PHYSICIAN: Jama Flavors, MD  DATE OF DISCHARGE: 03/19/2019  5:38 PM  PRIMARY CARE PHYSICIAN: Iloabachie, Chioma E, NP   ADMISSION DIAGNOSIS:  Stroke (HCC) [I63.9] Cerebrovascular accident (CVA), unspecified mechanism (HCC) [I63.9] DISCHARGE DIAGNOSIS:  Active Problems:   Stroke (HCC)  SECONDARY DIAGNOSIS:   Past Medical History:  Diagnosis Date   Allergy    Blood transfusion without reported diagnosis    Clotting disorder (HCC)    History of ITP 1986   Hyperlipidemia 07/04/2018   Stroke (HCC) 03/18/2019   HOSPITAL COURSE:  63 y.o. female known history of ITP and hyperlipidemia who presented with complaints of right-sided numbness and weakness in the right upper extremity over the last 3 days.  * Acute CVA with small hemorrhagic conversion - much improved and only has mild R hand grip weakness.  - MRI of the brain done which revealed small areas of acute infarct in the left posterior frontal and parietal lobe. Small area of hemorrhage in the left parietal operculum infarct. No mass-effect or midline shift. No significant chronic ischemia. CTH repeat no hemorrhage.   - ASA  daily, and plavic 75 mg po daily for 90 days followed by asa 325 mg daily only - CTA head and neck shows severe stenosis  * Chronic ITP: Stable platelets DISCHARGE CONDITIONS:  stable CONSULTS OBTAINED:  Treatment Team:  Kym Groom, MD DRUG ALLERGIES:   Allergies  Allergen Reactions   Cefizox [Ceftizoxime] Hives and Shortness Of Breath   Biaxin [Clarithromycin] Hives   DISCHARGE MEDICATIONS:   Allergies as of 03/19/2019      Reactions   Cefizox [ceftizoxime] Hives, Shortness Of Breath   Biaxin [clarithromycin] Hives      Medication List    TAKE these medications   aspirin EC 81 MG tablet Take 1  tablet (81 mg total) by mouth daily.   atorvastatin 40 MG tablet Commonly known as: LIPITOR Take 1 tablet (40 mg total) by mouth daily at 6 PM.   clopidogrel 75 MG tablet Commonly known as: Plavix Take 1 tablet (75 mg total) by mouth daily.        DISCHARGE INSTRUCTIONS:   DIET:  Cardiac diet DISCHARGE CONDITION:  Stable ACTIVITY:  Activity as tolerated OXYGEN:  Home Oxygen: No.  Oxygen Delivery: room air DISCHARGE LOCATION:  home with HHPT  If you experience worsening of your admission symptoms, develop shortness of breath, life threatening emergency, suicidal or homicidal thoughts you must seek medical attention immediately by calling 911 or calling your MD immediately  if symptoms less severe.  You Must read complete instructions/literature along with all the possible adverse reactions/side effects for all the Medicines you take and that have been prescribed to you. Take any new Medicines after you have completely understood and accpet all the possible adverse reactions/side effects.   Please note  You were cared for by a hospitalist during your hospital stay. If you have any questions about your discharge medications or the care you received while you were in the hospital after you are discharged, you can call the unit and asked to speak with the hospitalist on call if the hospitalist that took care of you is not available. Once you are discharged, your primary care physician will handle any further medical issues. Please note that NO REFILLS for  any discharge medications will be authorized once you are discharged, as it is imperative that you return to your primary care physician (or establish a relationship with a primary care physician if you do not have one) for your aftercare needs so that they can reassess your need for medications and monitor your lab values.    On the day of Discharge:  VITAL SIGNS:  Blood pressure 120/84, pulse 63, temperature 98.3 F (36.8 C),  resp. rate 19, height 5\' 5"  (1.651 m), weight 68.1 kg, last menstrual period 07/04/2005, SpO2 99 %. PHYSICAL EXAMINATION:  GENERAL:  63 y.o.-year-old patient lying in the bed with no acute distress.  EYES: Pupils equal, round, reactive to light and accommodation. No scleral icterus. Extraocular muscles intact.  HEENT: Head atraumatic, normocephalic. Oropharynx and nasopharynx clear.  NECK:  Supple, no jugular venous distention. No thyroid enlargement, no tenderness.  LUNGS: Normal breath sounds bilaterally, no wheezing, rales,rhonchi or crepitation. No use of accessory muscles of respiration.  CARDIOVASCULAR: S1, S2 normal. No murmurs, rubs, or gallops.  ABDOMEN: Soft, non-tender, non-distended. Bowel sounds present. No organomegaly or mass.  EXTREMITIES: No pedal edema, cyanosis, or clubbing.  NEUROLOGIC: Cranial nerves II through XII are intact. Muscle strength 5/5 in all extremities. Sensation intact. Gait not checked.  PSYCHIATRIC: The patient is alert and oriented x 3.  SKIN: No obvious rash, lesion, or ulcer.  DATA REVIEW:   CBC Recent Labs  Lab 03/19/19 0415  WBC 3.7*  HGB 12.0  HCT 36.6  PLT 188    Chemistries  Recent Labs  Lab 03/18/19 1158 03/19/19 0415  NA 136 141  K 4.0 4.1  CL 104 107  CO2 23 27  GLUCOSE 111* 100*  BUN 16 22  CREATININE 0.88 1.05*  CALCIUM 9.8 9.5  MG  --  2.3  AST 27  --   ALT 25  --   ALKPHOS 100  --   BILITOT 1.0  --      Follow-up Information    Iloabachie, Chioma E, NP. Schedule an appointment as soon as possible for a visit in 5 days.   Specialty: Gerontology Why: Left message for clinic to call you at home for hospital follow up. Please take discharge summary to appoinment for refferal to Dr. Manuella Ghazi.  Contact information: [place] [address] [phone]        Earlie Server, MD. Go on 04/18/2019.   Specialty: Oncology Why: @10 :15 AM Contact information: [place]  [id number] [phone]        Vladimir Crofts, MD. Schedule an appointment as soon as possible for a visit in 1 month.   Specialty: Neurology Contact information: [address] [facility] [place] [address] [phone]            RADIOLOGY:  Ct Angio Head W Or Wo Contrast  Result Date: 03/19/2019 CLINICAL DATA:  Stroke follow-up. Right upper extremity numbness and weakness. Acute left frontal and parietal infarcts on MRI. EXAM: CT ANGIOGRAPHY HEAD AND NECK TECHNIQUE: Multidetector CT imaging of the head and neck was performed using the standard protocol during bolus administration of intravenous contrast. Multiplanar CT image reconstructions and MIPs were obtained to evaluate the vascular anatomy. Carotid stenosis measurements (when applicable) are obtained utilizing NASCET criteria, using the distal internal carotid diameter as the denominator. CONTRAST:  62mL OMNIPAQUE IOHEXOL 350 MG/ML SOLN COMPARISON:  Head MRI 03/18/2019. Carotid Doppler ultrasound 03/18/2019. FINDINGS: CT HEAD FINDINGS Brain:  Small areas of hypoattenuation involving left frontal and parietal lobe cortex and white matter correspond to the acute infarcts on MRI. No acute intracranial hemorrhage is apparent by CT. No mass, midline shift, or extra-axial fluid collection is identified. The ventricles and sulci are normal in size. Vascular: Calcified atherosclerosis at the skull base. Skull: No fracture or focal osseous lesion. Sinuses: Polypoid mucosal thickening in both maxillary sinuses. Clear mastoid air cells. Orbits: Unremarkable. Review of the MIP images confirms the above findings CTA NECK FINDINGS Aortic arch: Standard 3 vessel aortic arch. Widely patent arch vessel origins. Right carotid system: Patent without evidence of stenosis or dissection. Left carotid system: Patent with minimal calcified plaque in the carotid bulb. No evidence of stenosis or dissection. Vertebral arteries: Patent  and codominant without evidence of stenosis or dissection. Skeleton: No acute osseous abnormality or suspicious osseous lesion. Other neck: No evidence of cervical lymphadenopathy or mass. Upper chest: No apical lung consolidation or mass. Review of the MIP images confirms the above findings CTA HEAD FINDINGS Anterior circulation: The internal carotid arteries are widely patent from skull base to carotid termini with minimal nonstenotic plaque on the left. ACAs and MCAs are patent without evidence of significant A1 or M1 stenosis. The right A1 segment is hypoplastic. There is a severe stenosis of the proximal left M2 inferior division. No aneurysm is identified. Posterior circulation: The intracranial vertebral arteries are widely patent to the basilar. Patent right PICA, bilateral AICA, and bilateral SCA origins are visualized. A left PICA is not clearly identified. The basilar artery is widely patent. There is a medium-sized left posterior communicating artery. Both PCAs are patent without evidence of significant proximal stenosis on the right. They are moderate proximal left P2 stenoses. No aneurysm is identified. Venous sinuses: Patent. Anatomic variants: Hypoplastic right A1. Review of the MIP images confirms the above findings IMPRESSION: 1. No large vessel occlusion. 2. Severe proximal left M2 stenosis. 3. Moderate proximal left P2 PCA stenoses. 4. Widely patent cervical carotid and vertebral arteries. Electronically Signed   By: Sebastian Ache M.D.   On: 03/19/2019 13:58   Ct Head Wo Contrast  Result Date: 03/19/2019 CLINICAL DATA:  Follow-up parenchymal hemorrhage seen on recent MRI. EXAM: CT HEAD WITHOUT CONTRAST TECHNIQUE: Contiguous axial images were obtained from the base of the skull through the vertex without intravenous contrast. COMPARISON:  CT 03/18/2019 as well as MRI 03/18/2019 FINDINGS: Brain: The ventricles, cisterns and other CSF spaces are within normal and unchanged. There is no mass, mass  effect or shift of midline structures. Subtle area of low attenuation over the left frontal subcortical white matter which may be related to patient's known area of acute infarction as seen on MRI. No evidence of acute hemorrhage. Vascular: No hyperdense vessel or unexpected calcification. Skull: Normal. Negative for fracture or focal lesion. Sinuses/Orbits: No acute finding. Other: None. IMPRESSION: No evidence of acute hemorrhage. Small region of low-attenuation over the left frontal subcortical white matter likely related to patient's known acute infarct as seen on MRI. Electronically Signed   By: Elberta Fortis M.D.   On: 03/19/2019 07:45   Ct Angio Neck W Or Wo Contrast  Result Date: 03/19/2019 CLINICAL DATA:  Stroke follow-up. Right upper extremity numbness and weakness. Acute left frontal and parietal infarcts on MRI. EXAM: CT ANGIOGRAPHY HEAD AND NECK TECHNIQUE: Multidetector CT imaging of the head and neck was performed using the standard protocol during bolus administration of intravenous contrast. Multiplanar CT image reconstructions and MIPs were obtained  to evaluate the vascular anatomy. Carotid stenosis measurements (when applicable) are obtained utilizing NASCET criteria, using the distal internal carotid diameter as the denominator. CONTRAST:  75mL OMNIPAQUE IOHEXOL 350 MG/ML SOLN COMPARISON:  Head MRI 03/18/2019. Carotid Doppler ultrasound 03/18/2019. FINDINGS: CT HEAD FINDINGS Brain: Small areas of hypoattenuation involving left frontal and parietal lobe cortex and white matter correspond to the acute infarcts on MRI. No acute intracranial hemorrhage is apparent by CT. No mass, midline shift, or extra-axial fluid collection is identified. The ventricles and sulci are normal in size. Vascular: Calcified atherosclerosis at the skull base. Skull: No fracture or focal osseous lesion. Sinuses: Polypoid mucosal thickening in both maxillary sinuses. Clear mastoid air cells. Orbits: Unremarkable. Review  of the MIP images confirms the above findings CTA NECK FINDINGS Aortic arch: Standard 3 vessel aortic arch. Widely patent arch vessel origins. Right carotid system: Patent without evidence of stenosis or dissection. Left carotid system: Patent with minimal calcified plaque in the carotid bulb. No evidence of stenosis or dissection. Vertebral arteries: Patent and codominant without evidence of stenosis or dissection. Skeleton: No acute osseous abnormality or suspicious osseous lesion. Other neck: No evidence of cervical lymphadenopathy or mass. Upper chest: No apical lung consolidation or mass. Review of the MIP images confirms the above findings CTA HEAD FINDINGS Anterior circulation: The internal carotid arteries are widely patent from skull base to carotid termini with minimal nonstenotic plaque on the left. ACAs and MCAs are patent without evidence of significant A1 or M1 stenosis. The right A1 segment is hypoplastic. There is a severe stenosis of the proximal left M2 inferior division. No aneurysm is identified. Posterior circulation: The intracranial vertebral arteries are widely patent to the basilar. Patent right PICA, bilateral AICA, and bilateral SCA origins are visualized. A left PICA is not clearly identified. The basilar artery is widely patent. There is a medium-sized left posterior communicating artery. Both PCAs are patent without evidence of significant proximal stenosis on the right. They are moderate proximal left P2 stenoses. No aneurysm is identified. Venous sinuses: Patent. Anatomic variants: Hypoplastic right A1. Review of the MIP images confirms the above findings IMPRESSION: 1. No large vessel occlusion. 2. Severe proximal left M2 stenosis. 3. Moderate proximal left P2 PCA stenoses. 4. Widely patent cervical carotid and vertebral arteries. Electronically Signed   By: Sebastian AcheAllen  Grady M.D.   On: 03/19/2019 13:58     Management plans discussed with the patient, family and they are in  agreement.  CODE STATUS: Full Code   TOTAL TIME TAKING CARE OF THIS PATIENT: 45 minutes.    Delfino LovettVipul Kinda Pottle M.D on 03/19/2019 at 5:46 PM  Between 7am to 6pm - Pager - [phone]  After 6pm go to [email or web address] - Social research officer, governmentpassword [email or web address]  [facility] [place] [facility]  Office  [phone]  CC: Primary care physician; Rolm GalaIloabachie, Chioma E, NP   Note: This dictation was prepared with Dragon dictation along with smaller phrase technology. Any transcriptional errors that result from this process are unintentional.

## 2019-03-19 NOTE — Consult Note (Signed)
Hematology/Oncology Consult note [facility] Telephone:([phone] [phone]  Patient Care Team: Rolm Gala, NP as PCP - General (Gerontology)   Name of the patient: Nancy Murray  [id number]  [birth year]   Date of visit: 03/19/19 REASON FOR COSULTATION:  History of ITP.  Dual antiplatelet use History of presenting illness-  63 y.o. female with PMH listed at below who presents to ER due to right-sided numbness and weakness in the right upper extremity for the past couple of days.  Also has had mild right hand grip weakness. She had MRI of the brain done on 03/18/2019 which showed a small area of acute infarct in the left posterior frontal and parietal lobe.  Repeat CT head on 03/19/2019 showed no hemorrhage. Small areas of hemorrhage in the left parietal operculum infarct Patient was seen and evaluated by neurology Dr.Zeylikman.  Neurology recommended to obtain CT angiogram neck and head.  Study showed severe proximal left M2 stenosis, moderate proximal left P2 PCA stenosis. Neurology recommended dual antiplatelet with aspirin and Plavix for 90 days Patient has a history of ITP and heme-onc was consulted to see if dual antiplatelet therapy can be done in the context of history of ITP.  Patient reports that she had 2 remote episodes of ITP flare.  First episode was in 1986, initial symptoms include easy bruising.  Was found to have low platelet counts and was admitted and treated with steroids.  Patient recalls that she had another episode later on.  But no recent flares.  Denies any easy bruising or any other acute bleeding events. Today she feels that the right side numbness has improved.  Denies any headache  Review of Systems  Constitutional: Negative for appetite change, chills, fatigue and fever.  HENT:   Negative for hearing loss and voice change.   Eyes: Negative for eye problems.  Respiratory: Negative for chest tightness and cough.     Cardiovascular: Negative for chest pain.  Gastrointestinal: Negative for abdominal distention, abdominal pain and blood in stool.  Endocrine: Negative for hot flashes.  Genitourinary: Negative for difficulty urinating and frequency.   Musculoskeletal: Negative for arthralgias.  Skin: Negative for itching and rash.  Neurological: Positive for numbness. Negative for extremity weakness.  Hematological: Negative for adenopathy.  Psychiatric/Behavioral: Negative for confusion.    Allergies  Allergen Reactions   Cefizox [Ceftizoxime] Hives and Shortness Of Breath   Biaxin [Clarithromycin] Hives    Patient Active Problem List   Diagnosis Date Noted   Stroke (HCC) 03/18/2019   Hyperlipidemia 07/04/2018   Prediabetes 07/04/2018   Clotting disorder (HCC) 06/01/2018   Healthcare maintenance 06/01/2018     Past Medical History:  Diagnosis Date   Allergy    Blood transfusion without reported diagnosis    Clotting disorder (HCC)    History of ITP 1986   Hyperlipidemia 07/04/2018   Stroke (HCC) 03/18/2019     Past Surgical History:  Procedure Laterality Date   ABDOMINAL HYSTERECTOMY     CESAREAN SECTION     LASER ABLATION OF THE CERVIX     NOSE SURGERY      Social History   Socioeconomic History   Marital status: Married    Spouse name: Not on file   Number of children: Not on file   Years of education: Not on file   Highest education level: Not on file  Occupational History   Not on file  Social Needs   Financial resource strain: Somewhat hard   Food  insecurity    Worry: Never true    Inability: Never true   Transportation needs    Medical: Yes    Non-medical: Yes  Tobacco Use   Smoking status: Never Smoker   Smokeless tobacco: Never Used  Substance and Sexual Activity   Alcohol use: Not on file   Drug use: Not on file   Sexual activity: Not on file  Lifestyle   Physical activity    Days per week: 7 days    Minutes per  session: 30 min   Stress: To some extent  Relationships   Social connections    Talks on phone: Never    Gets together: Once a week    Attends religious service: 1 to 4 times per year    Active member of club or organization: Yes    Attends meetings of clubs or organizations: 1 to 4 times per year    Relationship status: Married   Intimate partner violence    Fear of current or ex partner: No    Emotionally abused: No    Physically abused: No    Forced sexual activity: No  Other Topics Concern   Not on file  Social History Narrative   Not on file     History reviewed. No pertinent family history.  No current facility-administered medications for this encounter.   Current Outpatient Medications:    aspirin EC 81 MG tablet, Take 1 tablet (81 mg total) by mouth daily., Disp: 30 tablet, Rfl: 0   atorvastatin (LIPITOR) 40 MG tablet, Take 1 tablet (40 mg total) by mouth daily at 6 PM., Disp: 30 tablet, Rfl: 0   clopidogrel (PLAVIX) 75 MG tablet, Take 1 tablet (75 mg total) by mouth daily., Disp: 30 tablet, Rfl: 0   Physical exam:  Vitals:   03/19/19 0053 03/19/19 0301 03/19/19 0500 03/19/19 1536  BP: 107/80 111/72 120/75 120/84  Pulse: (!) 58 (!) 57 (!) 56 63  Resp: Temp: 98.1 F (36.7 C) 98.2 F (36.8 C) 98.4 F (36.9 C) 98.3 F (36.8 C)  TempSrc:      SpO2: 100% 100% 100% 99%  Weight:      Height:       Physical Exam  Constitutional: She is oriented to person, place, and time. No distress.  HENT:  Head: Normocephalic and atraumatic.  Mouth/Throat: No oropharyngeal exudate.  Eyes: Pupils are equal, round, and reactive to light. EOM are normal. No scleral icterus.  Neck: Normal range of motion. Neck supple.  Cardiovascular: Normal rate and regular rhythm.  No murmur heard. Pulmonary/Chest: Effort normal. No respiratory distress. She has no rales. She exhibits no tenderness.  Abdominal: Soft. She exhibits no distension. There is no abdominal  tenderness.  Musculoskeletal: Normal range of motion.        General: No edema.  Neurological: She is alert and oriented to person, place, and time. No cranial nerve deficit. She exhibits normal muscle tone. Coordination normal.  Skin: Skin is warm and dry. She is not diaphoretic. No erythema.  Psychiatric: Affect normal.        CMP Latest Ref Rng & Units 03/19/2019  Glucose 70 - 99 mg/dL 981(X)  BUN 8 - 23 mg/dL 22  Creatinine 9.14 - 7.82 mg/dL 9.56(O)  Sodium 130 - 865 mmol/L 141  Potassium 3.5 - 5.1 mmol/L 4.1  Chloride 98 - 111 mmol/L 107  CO2 22 - 32 mmol/L 27  Calcium 8.9 - 10.3 mg/dL 9.5  Total  Protein 6.5 - 8.1 g/dL -  Total Bilirubin 0.3 - 1.2 mg/dL -  Alkaline Phos 38 - 126 U/L -  AST 15 - 41 U/L -  ALT 0 - 44 U/L -   CBC Latest Ref Rng & Units 03/19/2019  WBC 4.0 - 10.5 K/uL 3.7(L)  Hemoglobin 12.0 - 15.0 g/dL 12.0  Hematocrit 36.0 - 46.0 % 36.6  Platelets 150 - 400 K/uL 188    RADIOGRAPHIC STUDIES: I have personally reviewed the radiological images as listed and agreed with the findings in the report.  Ct Angio Head W Or Wo Contrast  Result Date: 03/19/2019 CLINICAL DATA:  Stroke follow-up. Right upper extremity numbness and weakness. Acute left frontal and parietal infarcts on MRI. EXAM: CT ANGIOGRAPHY HEAD AND NECK TECHNIQUE: Multidetector CT imaging of the head and neck was performed using the standard protocol during bolus administration of intravenous contrast. Multiplanar CT image reconstructions and MIPs were obtained to evaluate the vascular anatomy. Carotid stenosis measurements (when applicable) are obtained utilizing NASCET criteria, using the distal internal carotid diameter as the denominator. CONTRAST:  38mL OMNIPAQUE IOHEXOL 350 MG/ML SOLN COMPARISON:  Head MRI 03/18/2019. Carotid Doppler ultrasound 03/18/2019. FINDINGS: CT HEAD FINDINGS Brain: Small areas of hypoattenuation involving left frontal and parietal lobe cortex and white matter correspond to  the acute infarcts on MRI. No acute intracranial hemorrhage is apparent by CT. No mass, midline shift, or extra-axial fluid collection is identified. The ventricles and sulci are normal in size. Vascular: Calcified atherosclerosis at the skull base. Skull: No fracture or focal osseous lesion. Sinuses: Polypoid mucosal thickening in both maxillary sinuses. Clear mastoid air cells. Orbits: Unremarkable. Review of the MIP images confirms the above findings CTA NECK FINDINGS Aortic arch: Standard 3 vessel aortic arch. Widely patent arch vessel origins. Right carotid system: Patent without evidence of stenosis or dissection. Left carotid system: Patent with minimal calcified plaque in the carotid bulb. No evidence of stenosis or dissection. Vertebral arteries: Patent and codominant without evidence of stenosis or dissection. Skeleton: No acute osseous abnormality or suspicious osseous lesion. Other neck: No evidence of cervical lymphadenopathy or mass. Upper chest: No apical lung consolidation or mass. Review of the MIP images confirms the above findings CTA HEAD FINDINGS Anterior circulation: The internal carotid arteries are widely patent from skull base to carotid termini with minimal nonstenotic plaque on the left. ACAs and MCAs are patent without evidence of significant A1 or M1 stenosis. The right A1 segment is hypoplastic. There is a severe stenosis of the proximal left M2 inferior division. No aneurysm is identified. Posterior circulation: The intracranial vertebral arteries are widely patent to the basilar. Patent right PICA, bilateral AICA, and bilateral SCA origins are visualized. A left PICA is not clearly identified. The basilar artery is widely patent. There is a medium-sized left posterior communicating artery. Both PCAs are patent without evidence of significant proximal stenosis on the right. They are moderate proximal left P2 stenoses. No aneurysm is identified. Venous sinuses: Patent. Anatomic  variants: Hypoplastic right A1. Review of the MIP images confirms the above findings IMPRESSION: 1. No large vessel occlusion. 2. Severe proximal left M2 stenosis. 3. Moderate proximal left P2 PCA stenoses. 4. Widely patent cervical carotid and vertebral arteries. Electronically Signed   By: Logan Bores M.D.   On: 03/19/2019 13:58   Ct Head Wo Contrast  Result Date: 03/19/2019 CLINICAL DATA:  Follow-up parenchymal hemorrhage seen on recent MRI. EXAM: CT HEAD WITHOUT CONTRAST TECHNIQUE: Contiguous axial images were obtained from  the base of the skull through the vertex without intravenous contrast. COMPARISON:  CT 03/18/2019 as well as MRI 03/18/2019 FINDINGS: Brain: The ventricles, cisterns and other CSF spaces are within normal and unchanged. There is no mass, mass effect or shift of midline structures. Subtle area of low attenuation over the left frontal subcortical white matter which may be related to patient's known area of acute infarction as seen on MRI. No evidence of acute hemorrhage. Vascular: No hyperdense vessel or unexpected calcification. Skull: Normal. Negative for fracture or focal lesion. Sinuses/Orbits: No acute finding. Other: None. IMPRESSION: No evidence of acute hemorrhage. Small region of low-attenuation over the left frontal subcortical white matter likely related to patient's known acute infarct as seen on MRI. Electronically Signed   By: Elberta Fortis M.D.   On: 03/19/2019 07:45   Ct Head Wo Contrast  Result Date: 03/18/2019 CLINICAL DATA:  Neurological deficits. Possible subacute stroke. Right arm numbness and weakness for several days. Slurred speech yesterday. EXAM: CT HEAD WITHOUT CONTRAST TECHNIQUE: Contiguous axial images were obtained from the base of the skull through the vertex without intravenous contrast. COMPARISON:  None. FINDINGS: Brain: No subdural, epidural, or subarachnoid hemorrhage identified. Cerebellum, brainstem, and basal cisterns are normal. Ventricles and  sulci are unremarkable. No acute cortical ischemia or infarct. No mass effect or midline shift. Vascular: No hyperdense vessel or unexpected calcification. Skull: Normal. Negative for fracture or focal lesion. Sinuses/Orbits: No acute finding. Other: None. IMPRESSION: No acute intracranial abnormalities are identified to explain the patient's symptoms. Electronically Signed   By: Gerome Sam III M.D   On: 03/18/2019 13:15   Ct Angio Neck W Or Wo Contrast  Result Date: 03/19/2019 CLINICAL DATA:  Stroke follow-up. Right upper extremity numbness and weakness. Acute left frontal and parietal infarcts on MRI. EXAM: CT ANGIOGRAPHY HEAD AND NECK TECHNIQUE: Multidetector CT imaging of the head and neck was performed using the standard protocol during bolus administration of intravenous contrast. Multiplanar CT image reconstructions and MIPs were obtained to evaluate the vascular anatomy. Carotid stenosis measurements (when applicable) are obtained utilizing NASCET criteria, using the distal internal carotid diameter as the denominator. CONTRAST:  22mL OMNIPAQUE IOHEXOL 350 MG/ML SOLN COMPARISON:  Head MRI 03/18/2019. Carotid Doppler ultrasound 03/18/2019. FINDINGS: CT HEAD FINDINGS Brain: Small areas of hypoattenuation involving left frontal and parietal lobe cortex and white matter correspond to the acute infarcts on MRI. No acute intracranial hemorrhage is apparent by CT. No mass, midline shift, or extra-axial fluid collection is identified. The ventricles and sulci are normal in size. Vascular: Calcified atherosclerosis at the skull base. Skull: No fracture or focal osseous lesion. Sinuses: Polypoid mucosal thickening in both maxillary sinuses. Clear mastoid air cells. Orbits: Unremarkable. Review of the MIP images confirms the above findings CTA NECK FINDINGS Aortic arch: Standard 3 vessel aortic arch. Widely patent arch vessel origins. Right carotid system: Patent without evidence of stenosis or dissection.  Left carotid system: Patent with minimal calcified plaque in the carotid bulb. No evidence of stenosis or dissection. Vertebral arteries: Patent and codominant without evidence of stenosis or dissection. Skeleton: No acute osseous abnormality or suspicious osseous lesion. Other neck: No evidence of cervical lymphadenopathy or mass. Upper chest: No apical lung consolidation or mass. Review of the MIP images confirms the above findings CTA HEAD FINDINGS Anterior circulation: The internal carotid arteries are widely patent from skull base to carotid termini with minimal nonstenotic plaque on the left. ACAs and MCAs are patent without evidence of significant A1 or  M1 stenosis. The right A1 segment is hypoplastic. There is a severe stenosis of the proximal left M2 inferior division. No aneurysm is identified. Posterior circulation: The intracranial vertebral arteries are widely patent to the basilar. Patent right PICA, bilateral AICA, and bilateral SCA origins are visualized. A left PICA is not clearly identified. The basilar artery is widely patent. There is a medium-sized left posterior communicating artery. Both PCAs are patent without evidence of significant proximal stenosis on the right. They are moderate proximal left P2 stenoses. No aneurysm is identified. Venous sinuses: Patent. Anatomic variants: Hypoplastic right A1. Review of the MIP images confirms the above findings IMPRESSION: 1. No large vessel occlusion. 2. Severe proximal left M2 stenosis. 3. Moderate proximal left P2 PCA stenoses. 4. Widely patent cervical carotid and vertebral arteries. Electronically Signed   By: Sebastian AcheAllen  Grady M.D.   On: 03/19/2019 13:58   Mr Brain Wo Contrast  Result Date: 03/18/2019 CLINICAL DATA:  Stroke. Right arm numbness and weakness several days. Slurred speech. EXAM: MRI HEAD WITHOUT CONTRAST TECHNIQUE: Multiplanar, multiecho pulse sequences of the brain and surrounding structures were obtained without intravenous  contrast. COMPARISON:  CT head 03/18/2019 FINDINGS: Brain: Small area of acute infarct in the left posterior frontal cortex. Small areas of acute infarct in the right parietal cortex. Small area of acute infarct with hemorrhage in the left parietal operculum. Ventricle size normal. No significant chronic ischemia. No mass or edema. Vascular: Normal arterial flow voids. Skull and upper cervical spine: Negative Sinuses/Orbits: Moderate mucosal edema paranasal sinuses. Normal orbit Other: None IMPRESSION: Small areas of acute infarct in the left posterior frontal and parietal lobe. Small area of hemorrhage in the left parietal operculum infarct. No mass-effect or midline shift. No significant chronic ischemia. Electronically Signed   By: Marlan Palauharles  Clark M.D.   On: 03/18/2019 14:39   [place] Carotid Bilateral (at [facility] And [facility] Only)  Result Date: 03/19/2019 CLINICAL DATA:  63 year old female with a history of stroke EXAM: BILATERAL CAROTID DUPLEX ULTRASOUND TECHNIQUE: Wallace CullensGray scale imaging, color Doppler and duplex ultrasound were performed of bilateral carotid and vertebral arteries in the neck. COMPARISON:  None. FINDINGS: Criteria: Quantification of carotid stenosis is based on velocity parameters that correlate the residual internal carotid diameter with NASCET-based stenosis levels, using the diameter of the distal internal carotid lumen as the denominator for stenosis measurement. The following velocity measurements were obtained: RIGHT ICA:  Systolic 50 cm/sec, Diastolic 20 cm/sec CCA:  46 cm/sec SYSTOLIC ICA/CCA RATIO:  1.1 ECA:  53 cm/sec LEFT ICA:  Systolic 81 cm/sec, Diastolic 39 cm/sec CCA:  50 cm/sec SYSTOLIC ICA/CCA RATIO:  1.6 ECA:  64 cm/sec Right Brachial SBP: Not acquired Left Brachial SBP: Not acquired RIGHT CAROTID ARTERY: No significant calcifications of the right common carotid artery. Intermediate waveform maintained. Heterogeneous and partially calcified plaque at the right carotid bifurcation. No  significant lumen shadowing. Low resistance waveform of the right ICA. No significant tortuosity. RIGHT VERTEBRAL ARTERY: Antegrade flow with low resistance waveform. LEFT CAROTID ARTERY: No significant calcifications of the left common carotid artery. Intermediate waveform maintained. Heterogeneous and partially calcified plaque at the left carotid bifurcation without significant lumen shadowing. Low resistance waveform of the left ICA. No significant tortuosity. LEFT VERTEBRAL ARTERY:  Antegrade flow with low resistance waveform. IMPRESSION: Color duplex indicates minimal heterogeneous and calcified plaque, with no hemodynamically significant stenosis by duplex criteria in the extracranial cerebrovascular circulation. Signed, Yvone NeuJaime S. Reyne DumasWagner, DO, RPVI [facility] Electronically Signed  By: Gilmer Mor D.O.   On: 03/19/2019 08:05    Assessment and plan- Patient is a 63 y.o. female with a history of ITP, hyperlipidemia who presented to emergency room for evaluation of right-sided numbness and weakness of the right upper extremity. MRI showed small areas of acute infarct in the left posterior frontal and parietal lobe.  Also small area of hemorrhage in the left parietal operculum infarct.  Repeat CT head showed no hemorrhage. CT a head and neck showed severe stenosis therefore neurology recommend dual antiplatelet therapy with aspirin and Plavix for 90 days followed by aspirin. Patient has a history of remote ITP, reviewed patient's blood work in current EMR. She has had normal platelet counts since at least 2013. I discussed with the patient that from hematology aspect, her history of ITP is not a contraindication for dual platelet therapy recommended by neurology.  Recommend monitoring CBC 2 weeks after discharge and every 4 weeks prior on dual platelet therapy.  Potential bleeding risk with dual antiplatelet treatment was discussed.  Discussed  that if she experiences severe headache, acute bleeding, easy bruising, she should seek medical advice immediately.  She voices understanding.  I gave her my name card so that she can follow up with me outpatient.  Patient informs me that she does not have insurance coverage and is not able to follow-up with me.  She usually [facility] a free [facility] for primary care.  I wrote down recommendation of CBC monitoring and she will follow-up with free clinic.    Thank you for allowing me to participate in the care of this patient.  Plan was discussed with Dr. Sherryll Burger in the neurology via secure chart. Total face to face encounter time for this patient visit was 50 min. >50% of the time was  spent in counseling and coordination of care.    Rickard Patience, MD, PhD Hematology Oncology [facility] Pager- [phone] 03/19/2019

## 2019-03-19 NOTE — Progress Notes (Signed)
Pt to CT

## 2019-03-19 NOTE — Progress Notes (Signed)
Chart reviewed. Pt visited. Pt is currently able to communicate needs and wants without difficulty. Speech is clear and appropriate. Pt reports speech is back to baseline. She reports she occasionally has trouble organizing her thoughts. Encouraged Pt to f/u with PCP at discharge and if any further speech deficits, can follow up as outpatient. No ST needs at this time.

## 2019-03-19 NOTE — Clinical Social Work Note (Addendum)
CSW acknowledges consult for home health and medication needs. Patient is independent in room and both PT and OT recommended no follow up. Per MD, the only new medication patient is discharging on is Aspirin which she can pick up anywhere. PCP is Caryl Asp, NP. No TOC needs at this time.   CSW signing off.  Dayton Scrape, [place]  3:19 pm Per MD, patient will also discharge on Lipitor and Plavix. Patient goes to [facility] and last saw them a week ago. She confirmed she has no insurance and cannot afford medications. CSW faxed prescriptions to medication management. Patient said her husband can pick up the medications if ready by closing time. Will give patient medication management application to receive follow up medications at [facility].  Dayton Scrape, [place]  3:44 pm Patient will have her husband pick up her medications at 4:15. RN aware. No further concerns. CSW signing off.  Dayton Scrape, [place]

## 2019-03-19 NOTE — Plan of Care (Signed)
Pt improved since admission. Pt states only a small amount of weakness in pinching right fingers together to pick up something from table is noticeable. Pt alert and oriented. No complaints of pain. Pt hoping to be d/c'd today.

## 2019-03-19 NOTE — Consult Note (Signed)
Reason for Consult: stroke Referring Physician: Dr. Sherryll Burger  CC:  R sided weakness   HPI: Nancy Murray is an 63 y.o. female known history of ITP and hyperlipidemia who presented with complaints of right-sided numbness and weakness in the right upper extremity over the last 3 days.She is much improved and only has mild R hand grip weakness.  Patient subsequently had MRI of the brain done which revealed small areas of acute infarct in the left posterior frontal and parietal lobe. Small area of hemorrhage in the left parietal operculum infarct. No mass-effect or midline shift. No significant chronic ischemia. CTH repeat no hemorrhage.    Past Medical History:  Diagnosis Date  . Allergy   . Blood transfusion without reported diagnosis   . Clotting disorder (HCC)   . History of ITP 1986  . Hyperlipidemia 07/04/2018  . Stroke [facility]) 03/18/2019    Past Surgical History:  Procedure Laterality Date  . ABDOMINAL HYSTERECTOMY    . CESAREAN SECTION    . LASER ABLATION OF THE CERVIX    . NOSE SURGERY      History reviewed. No pertinent family history.  Social History:  reports that she has never smoked. She has never used smokeless tobacco. No history on file for alcohol and drug.  Allergies  Allergen Reactions  . Cefizox [Ceftizoxime] Hives and Shortness Of Breath  . Biaxin [Clarithromycin] Hives    Medications: I have reviewed the patient's current medications.  ROS: History obtained from the patient  General ROS: negative for - chills, fatigue, fever, night sweats, weight gain or weight loss Psychological ROS: negative for - behavioral disorder, hallucinations, memory difficulties, mood swings or suicidal ideation Ophthalmic ROS: negative for - blurry vision, double vision, eye pain or loss of vision ENT ROS: negative for - epistaxis, nasal discharge, oral lesions, sore throat, tinnitus or vertigo Allergy and Immunology ROS: negative for - hives or itchy/watery eyes Hematological and  Lymphatic ROS: negative for - bleeding problems, bruising or swollen lymph nodes Endocrine ROS: negative for - galactorrhea, hair pattern changes, polydipsia/polyuria or temperature intolerance Respiratory ROS: negative for - cough, hemoptysis, shortness of breath or wheezing Cardiovascular ROS: negative for - chest pain, dyspnea on exertion, edema or irregular heartbeat Gastrointestinal ROS: negative for - abdominal pain, diarrhea, hematemesis, nausea/vomiting or stool incontinence Genito-Urinary ROS: negative for - dysuria, hematuria, incontinence or urinary frequency/urgency Musculoskeletal ROS: negative for - joint swelling or muscular weakness Neurological ROS: as noted in HPI Dermatological ROS: negative for rash and skin lesion changes  Physical Examination: Blood pressure 120/75, pulse (!) 56, temperature 98.4 F (36.9 C), resp. rate 14, height  (1.651 m), weight 68.1 kg, last menstrual period 07/04/2005, SpO2 100 %.   Neurological Examination   Mental Status: Alert, oriented, thought content appropriate.  Speech fluent without evidence of aphasia.  Able to follow 3 step commands without difficulty. Cranial Nerves: II: Discs flat bilaterally; Visual fields grossly normal, pupils equal, round, reactive to light and accommodation III,IV, VI: ptosis not present, extra-ocular motions intact bilaterally V,VII: smile symmetric, facial light touch sensation normal bilaterally VIII: hearing normal bilaterally IX,X: gag reflex present XI: bilateral shoulder shrug XII: midline tongue extension Motor: Right : Upper extremity   5/5 proximally 4+/5 R hand grip    Left:     Upper extremity   5/5  Lower extremity   5/5     Lower extremity   5/5 Tone and bulk:normal tone throughout; no atrophy noted Sensory: Pinprick and light touch intact throughout,  bilaterally Deep Tendon Reflexes: 2+ and symmetric throughout Plantars: Right: downgoing   Left: downgoing Cerebellar: normal  finger-to-nose, normal rapid alternating movements and normal heel-to-shin test Gait: not tested       Laboratory Studies:   Basic Metabolic Panel: Recent Labs  Lab 03/18/19 1158 03/19/19 0415  NA 136 141  K 4.0 4.1  CL 104 107  CO2 23 27  GLUCOSE 111* 100*  BUN 16 22  CREATININE 0.88 1.05*  CALCIUM 9.8 9.5  MG  --  2.3    Liver Function Tests: Recent Labs  Lab 03/18/19 1158  AST 27  ALT 25  ALKPHOS 100  BILITOT 1.0  PROT 7.6  ALBUMIN 4.7   No results for input(s): LIPASE, AMYLASE in the last 168 hours. No results for input(s): AMMONIA in the last 168 hours.  CBC: Recent Labs  Lab 03/18/19 1158 03/19/19 0415  WBC 3.0* 3.7*  NEUTROABS 1.0*  --   HGB 13.1 12.0  HCT 39.0 36.6  MCV 85.7 86.9  PLT 210 188    Cardiac Enzymes: No results for input(s): CKTOTAL, CKMB, CKMBINDEX, TROPONINI in the last 168 hours.  BNP: Invalid input(s): POCBNP  CBG: No results for input(s): GLUCAP in the last 168 hours.  Microbiology: Results for orders placed or performed during the hospital encounter of 03/18/19  SARS CORONAVIRUS 2 (TAT 6-24 HRS) Nasopharyngeal Nasopharyngeal Swab     Status: None   Collection Time: 03/18/19  3:54 PM   Specimen: Nasopharyngeal Swab  Result Value Ref Range Status   SARS Coronavirus 2 NEGATIVE NEGATIVE Final    Comment: (NOTE) SARS-CoV-2 target nucleic acids are NOT DETECTED. The SARS-CoV-2 RNA is generally detectable in upper and lower respiratory specimens during the acute phase of infection. Negative results do not preclude SARS-CoV-2 infection, do not rule out co-infections with other pathogens, and should not be used as the sole basis for treatment or other patient management decisions. Negative results must be combined with clinical observations, patient history, and epidemiological information. The expected result is Negative. Fact Sheet for Patients: HairSlick.no Fact Sheet for Healthcare  Providers: quierodirigir.com This test is not yet approved or cleared by the [place] FDA and  has been authorized for detection and/or diagnosis of SARS-CoV-2 by FDA under an Emergency Use Authorization (EUA). This EUA will remain  in effect (meaning this test can be used) for the duration of the COVID-19 declaration under Section 56 4(b)(1) of the Act, 21 U.S.C. section 360bbb-3(b)(1), unless the authorization is terminated or revoked sooner. Performed at [facility], [address], [place], [place] [address]     Coagulation Studies: Recent Labs    03/18/19 1158  LABPROT 13.0  INR 1.0    Urinalysis: No results for input(s): COLORURINE, LABSPEC, PHURINE, GLUCOSEU, HGBUR, BILIRUBINUR, KETONESUR, PROTEINUR, UROBILINOGEN, NITRITE, LEUKOCYTESUR in the last 168 hours.  Invalid input(s): APPERANCEUR  Lipid Panel:     Component Value Date/Time   CHOL 228 (H) 03/19/2019 0415   CHOL 255 (H) 03/01/2019 1818   TRIG 75 03/19/2019 0415   HDL 56 03/19/2019 0415   HDL 64 03/01/2019 1818   CHOLHDL 4.1 03/19/2019 0415   VLDL 15 03/19/2019 0415   LDLCALC 157 (H) 03/19/2019 0415   LDLCALC 190 (H) 06/01/2018 1858    HgbA1C:  Lab Results  Component Value Date   HGBA1C 5.8 (H) 03/19/2019    Urine Drug Screen:  No results found for: LABOPIA, COCAINSCRNUR, LABBENZ, AMPHETMU, THCU, LABBARB  Alcohol Level: No results for  input(s): ETH in the last 168 hours.  Other results: EKG: normal EKG, normal sinus rhythm, unchanged from previous tracings.  Imaging: Ct Head Wo Contrast  Result Date: 03/19/2019 CLINICAL DATA:  Follow-up parenchymal hemorrhage seen on recent MRI. EXAM: CT HEAD WITHOUT CONTRAST TECHNIQUE: Contiguous axial images were obtained from the base of the skull through the vertex without intravenous contrast. COMPARISON:  CT 03/18/2019 as well as MRI 03/18/2019 FINDINGS: Brain: The ventricles, cisterns and other CSF spaces are within normal  and unchanged. There is no mass, mass effect or shift of midline structures. Subtle area of low attenuation over the left frontal subcortical white matter which may be related to patient's known area of acute infarction as seen on MRI. No evidence of acute hemorrhage. Vascular: No hyperdense vessel or unexpected calcification. Skull: Normal. Negative for fracture or focal lesion. Sinuses/Orbits: No acute finding. Other: None. IMPRESSION: No evidence of acute hemorrhage. Small region of low-attenuation over the left frontal subcortical white matter likely related to patient's known acute infarct as seen on MRI. Electronically Signed   By: Marin Olp M.D.   On: 03/19/2019 07:45   Ct Head Wo Contrast  Result Date: 03/18/2019 CLINICAL DATA:  Neurological deficits. Possible subacute stroke. Right arm numbness and weakness for several days. Slurred speech yesterday. EXAM: CT HEAD WITHOUT CONTRAST TECHNIQUE: Contiguous axial images were obtained from the base of the skull through the vertex without intravenous contrast. COMPARISON:  None. FINDINGS: Brain: No subdural, epidural, or subarachnoid hemorrhage identified. Cerebellum, brainstem, and basal cisterns are normal. Ventricles and sulci are unremarkable. No acute cortical ischemia or infarct. No mass effect or midline shift. Vascular: No hyperdense vessel or unexpected calcification. Skull: Normal. Negative for fracture or focal lesion. Sinuses/Orbits: No acute finding. Other: None. IMPRESSION: No acute intracranial abnormalities are identified to explain the patient's symptoms. Electronically Signed   By: Dorise Bullion III M.D   On: 03/18/2019 13:15   Mr Brain Wo Contrast  Result Date: 03/18/2019 CLINICAL DATA:  Stroke. Right arm numbness and weakness several days. Slurred speech. EXAM: MRI HEAD WITHOUT CONTRAST TECHNIQUE: Multiplanar, multiecho pulse sequences of the brain and surrounding structures were obtained without intravenous contrast. COMPARISON:   CT head 03/18/2019 FINDINGS: Brain: Small area of acute infarct in the left posterior frontal cortex. Small areas of acute infarct in the right parietal cortex. Small area of acute infarct with hemorrhage in the left parietal operculum. Ventricle size normal. No significant chronic ischemia. No mass or edema. Vascular: Normal arterial flow voids. Skull and upper cervical spine: Negative Sinuses/Orbits: Moderate mucosal edema paranasal sinuses. Normal orbit Other: None IMPRESSION: Small areas of acute infarct in the left posterior frontal and parietal lobe. Small area of hemorrhage in the left parietal operculum infarct. No mass-effect or midline shift. No significant chronic ischemia. Electronically Signed   By: Franchot Gallo M.D.   On: 03/18/2019 14:39   US Carotid Bilateral (at [facility] Only)  Result Date: 03/19/2019 CLINICAL DATA:  63 year old female with a history of stroke EXAM: BILATERAL CAROTID DUPLEX ULTRASOUND TECHNIQUE: Pearline Cables scale imaging, color Doppler and duplex ultrasound were performed of bilateral carotid and vertebral arteries in the neck. COMPARISON:  None. FINDINGS: Criteria: Quantification of carotid stenosis is based on velocity parameters that correlate the residual internal carotid diameter with NASCET-based stenosis levels, using the diameter of the distal internal carotid lumen as the denominator for stenosis measurement. The following velocity measurements were obtained: RIGHT ICA:  Systolic 50 cm/sec, Diastolic 20 cm/sec CCA:  46  cm/sec SYSTOLIC ICA/CCA RATIO:  1.1 ECA:  53 cm/sec LEFT ICA:  Systolic 81 cm/sec, Diastolic 39 cm/sec CCA:  50 cm/sec SYSTOLIC ICA/CCA RATIO:  1.6 ECA:  64 cm/sec Right Brachial SBP: Not acquired Left Brachial SBP: Not acquired RIGHT CAROTID ARTERY: No significant calcifications of the right common carotid artery. Intermediate waveform maintained. Heterogeneous and partially calcified plaque at the right carotid bifurcation. No significant lumen shadowing.  Low resistance waveform of the right ICA. No significant tortuosity. RIGHT VERTEBRAL ARTERY: Antegrade flow with low resistance waveform. LEFT CAROTID ARTERY: No significant calcifications of the left common carotid artery. Intermediate waveform maintained. Heterogeneous and partially calcified plaque at the left carotid bifurcation without significant lumen shadowing. Low resistance waveform of the left ICA. No significant tortuosity. LEFT VERTEBRAL ARTERY:  Antegrade flow with low resistance waveform. IMPRESSION: Color duplex indicates minimal heterogeneous and calcified plaque, with no hemodynamically significant stenosis by duplex criteria in the extracranial cerebrovascular circulation. Signed, Yvone NeuJaime S. Reyne DumasWagner, DO, RPVI [facility] Electronically Signed   By: Gilmer MorJaime  Wagner D.O.   On: 03/19/2019 08:05     Assessment/Plan:  63 y.o. female known history of ITP and hyperlipidemia who presented with complaints of right-sided numbness and weakness in the right upper extremity over the last 3 days.She is much improved and only has mild R hand grip weakness.  Patient subsequently had MRI of the brain done which revealed small areas of acute infarct in the left posterior frontal and parietal lobe. Small area of hemorrhage in the left parietal operculum infarct. No mass-effect or midline shift. No significant chronic ischemia. CTH repeat no hemorrhage.    - weakness RUE grip - ASA 81mg  daily, plt count 188 - CTA head and neck ordered - if no stenosis intracranial on CTA can likely be d/c today if cleared by pt/ot  03/19/2019, 10:24 AM

## 2019-03-19 NOTE — Progress Notes (Signed)
Care of patient taken over from [place], [place].  Patient resting comfortably in bed.  Message sent to Dr. Manuella Ghazi to see if plan is still for patient to discharge.  Clarise Cruz, BSN

## 2019-03-20 ENCOUNTER — Telehealth: Payer: Self-pay | Admitting: Gerontology

## 2019-03-20 LAB — HIV ANTIBODY (ROUTINE TESTING W REFLEX): HIV Screen 4th Generation wRfx: NONREACTIVE

## 2019-03-20 NOTE — Telephone Encounter (Signed)
Left VM  On 9/29 @ 3:19. Asked pt to call back to schedule lab and f/u.

## 2019-03-23 ENCOUNTER — Telehealth: Payer: Self-pay | Admitting: *Deleted

## 2019-03-23 NOTE — Telephone Encounter (Signed)
yes

## 2019-03-23 NOTE — Telephone Encounter (Signed)
Patient called asking if she can take MVI with her other medications. Please advise

## 2019-03-23 NOTE — Telephone Encounter (Signed)
Call returned to patient and advised per Dr Lissa Merlin to take MVI

## 2019-04-05 ENCOUNTER — Other Ambulatory Visit: Payer: Self-pay

## 2019-04-12 ENCOUNTER — Other Ambulatory Visit: Payer: Self-pay

## 2019-04-12 ENCOUNTER — Ambulatory Visit: Payer: Self-pay

## 2019-04-12 DIAGNOSIS — E782 Mixed hyperlipidemia: Secondary | ICD-10-CM

## 2019-04-13 LAB — CBC WITH DIFFERENTIAL/PLATELET
Basophils Absolute: 0 10*3/uL (ref 0.0–0.2)
Basos: 2 %
EOS (ABSOLUTE): 0.1 10*3/uL (ref 0.0–0.4)
Eos: 4 %
Hematocrit: 34.8 % (ref 34.0–46.6)
Hemoglobin: 11.7 g/dL (ref 11.1–15.9)
Immature Grans (Abs): 0 10*3/uL (ref 0.0–0.1)
Immature Granulocytes: 1 %
Lymphocytes Absolute: 0.8 10*3/uL (ref 0.7–3.1)
Lymphs: 30 %
MCH: 28.8 pg (ref 26.6–33.0)
MCHC: 33.6 g/dL (ref 31.5–35.7)
MCV: 86 fL (ref 79–97)
Monocytes Absolute: 0.4 10*3/uL (ref 0.1–0.9)
Monocytes: 17 %
Neutrophils Absolute: 1.3 10*3/uL — ABNORMAL LOW (ref 1.4–7.0)
Neutrophils: 46 %
Platelets: 242 10*3/uL (ref 150–450)
RBC: 4.06 x10E6/uL (ref 3.77–5.28)
RDW: 13 % (ref 11.7–15.4)
WBC: 2.7 10*3/uL — ABNORMAL LOW (ref 3.4–10.8)

## 2019-04-13 LAB — LIPID PANEL
Chol/HDL Ratio: 2.7 ratio (ref 0.0–4.4)
Cholesterol, Total: 115 mg/dL (ref 100–199)
HDL: 43 mg/dL (ref >39)
LDL Chol Calc (NIH): 59 mg/dL (ref 0–99)
Triglycerides: 57 mg/dL (ref 0–149)
VLDL Cholesterol Cal: 13 mg/dL (ref 5–40)

## 2019-04-17 ENCOUNTER — Other Ambulatory Visit: Payer: Self-pay

## 2019-04-17 ENCOUNTER — Encounter: Payer: Self-pay | Admitting: Gerontology

## 2019-04-17 ENCOUNTER — Ambulatory Visit: Payer: Self-pay | Admitting: Gerontology

## 2019-04-17 VITALS — HR 60 | Temp 97.7°F | Ht 64.0 in | Wt 149.0 lb

## 2019-04-17 DIAGNOSIS — D72819 Decreased white blood cell count, unspecified: Secondary | ICD-10-CM

## 2019-04-17 DIAGNOSIS — Z09 Encounter for follow-up examination after completed treatment for conditions other than malignant neoplasm: Secondary | ICD-10-CM

## 2019-04-17 DIAGNOSIS — I639 Cerebral infarction, unspecified: Secondary | ICD-10-CM | POA: Insufficient documentation

## 2019-04-17 DIAGNOSIS — R7303 Prediabetes: Secondary | ICD-10-CM

## 2019-04-17 MED ORDER — ASPIRIN EC 81 MG PO TBEC: 81.0000 mg | DELAYED_RELEASE_TABLET | Freq: Every day | ORAL | 3 refills | Status: AC

## 2019-04-17 MED ORDER — ATORVASTATIN CALCIUM 40 MG PO TABS: 40.0000 mg | ORAL_TABLET | Freq: Every day | ORAL | 3 refills | Status: DC

## 2019-04-17 MED ORDER — CLOPIDOGREL BISULFATE 75 MG PO TABS: 75.0000 mg | ORAL_TABLET | Freq: Every day | ORAL | 3 refills | Status: DC

## 2019-04-17 NOTE — Progress Notes (Signed)
Established Patient Office Visit  Subjective:  Patient ID: Nancy Murray, female    DOB: 1955/07/24  Age: 63 y.o. MRN: [id number]  CC: No chief complaint on file. Patient consents to telephone visit and 2 patient identifiers was used to identify patient.  HPI Nancy Murray presents for follow up after hospital discharge for CVA on 03/19/2019. She reports that she continues to experience only mild weakness to her right hand. She denies numbness, but states that she occasionally drop things such as her pen. She continues to take 75 mg Clopidogrel daily and 81 mg Aspirin, she denies hematuria, bruises, hematochezia and black tarry stool. Her Lipid panel  that was done on 04/12/2019 was within normal limits, she denies myalgia and continues to take 40 mg Atorvastatin daily. She was notified about her HgbA1c of 5.8% that was done one month ago. Her WBC was 2.7, she states that she has a history of Leucopenia many years ago, was evaluated and it was normal. She denies any signs and symptoms of infection. She states that she was doing well and offers no further complaint.  Past Medical History:  Diagnosis Date  . Allergy   . Blood transfusion without reported diagnosis   . Clotting disorder ([place])   . History of ITP 1986  . Hyperlipidemia 07/04/2018  . Stroke [facility]) 03/18/2019    Past Surgical History:  Procedure Laterality Date  . ABDOMINAL HYSTERECTOMY    . CESAREAN SECTION    . LASER ABLATION OF THE CERVIX    . NOSE SURGERY      No family history on file.  Social History   Socioeconomic History  . Marital status: Married    Spouse name: Not on file  . Number of children: Not on file  . Years of education: Not on file  . Highest education level: Not on file  Occupational History  . Not on file  Social Needs  . Financial resource strain: Somewhat hard  . Food insecurity    Worry: Never true    Inability: Never true  . Transportation needs    Medical: Yes    Non-medical: Yes   Tobacco Use  . Smoking status: Never Smoker  . Smokeless tobacco: Never Used  Substance and Sexual Activity  . Alcohol use: Not Currently  . Drug use: Never  . Sexual activity: Not on file  Lifestyle  . Physical activity    Days per week: 7 days    Minutes per session: 30 min  . Stress: To some extent  Relationships  . Social Herbalist on phone: Never    Gets together: Once a week    Attends religious service: 1 to 4 times per year    Active member of club or organization: Yes    Attends meetings of clubs or organizations: 1 to 4 times per year    Relationship status: Married  . Intimate partner violence    Fear of current or ex partner: No    Emotionally abused: No    Physically abused: No    Forced sexual activity: No  Other Topics Concern  . Not on file  Social History Narrative  . Not on file    Outpatient Medications Prior to Visit  Medication Sig Dispense Refill  . aspirin EC 81 MG tablet Take 1 tablet (81 mg total) by mouth daily. 30 tablet 0  . atorvastatin (LIPITOR) 40 MG tablet Take 1 tablet (40 mg total) by mouth daily at 6 PM.  30 tablet 0  . clopidogrel (PLAVIX) 75 MG tablet Take 1 tablet (75 mg total) by mouth daily. 30 tablet 0   No facility-administered medications prior to visit.     Allergies  Allergen Reactions  . Cefizox [Ceftizoxime] Hives and Shortness Of Breath  . Biaxin [Clarithromycin] Hives    ROS Review of Systems  Constitutional: Negative.   Respiratory: Negative.   Cardiovascular: Negative.   Musculoskeletal: Negative.   Skin: Negative.   Neurological: Positive for weakness (right hand).  Psychiatric/Behavioral: Negative.       Objective:    Physical Exam No PE was done Pulse 60   Temp 97.7 F (36.5 C)   Ht 5\' 4"  (1.626 m)   Wt 149 lb (67.6 kg)   LMP 07/04/2005   SpO2 99%   BMI 25.58 kg/m  Wt Readings from Last 3 Encounters:  04/17/19 149 lb (67.6 kg)  03/18/19 150 lb 3.2 oz (68.1 kg)  09/07/18 155 lb  14.4 oz (70.7 kg)     Health Maintenance Due  Topic Date Due  . PAP SMEAR-Modifier  10/18/1975    There are no preventive care reminders to display for this patient.  Lab Results  Component Value Date   TSH 0.958 06/01/2018   Lab Results  Component Value Date   WBC 2.7 (L) 04/12/2019   HGB 11.7 04/12/2019   HCT 34.8 04/12/2019   MCV 86 04/12/2019   PLT 242 04/12/2019   Lab Results  Component Value Date   NA 141 03/19/2019   K 4.1 03/19/2019   CO2 27 03/19/2019   GLUCOSE 100 (H) 03/19/2019   BUN 22 03/19/2019   CREATININE 1.05 (H) 03/19/2019   BILITOT 1.0 03/18/2019   ALKPHOS 100 03/18/2019   AST 27 03/18/2019   ALT 25 03/18/2019   PROT 7.6 03/18/2019   ALBUMIN 4.7 03/18/2019   CALCIUM 9.5 03/19/2019   ANIONGAP 7 03/19/2019   Lab Results  Component Value Date   CHOL 115 04/12/2019   Lab Results  Component Value Date   HDL 43 04/12/2019   Lab Results  Component Value Date   LDLCALC 59 04/12/2019   Lab Results  Component Value Date   TRIG 57 04/12/2019   Lab Results  Component Value Date   CHOLHDL 2.7 04/12/2019   Lab Results  Component Value Date   HGBA1C 5.8 (H) 03/19/2019      Assessment & Plan:   1. Cerebrovascular accident (CVA), unspecified mechanism (HCC) - She was encouraged to complete charity care application for - Ambulatory referral to Neurology. She was advised to notify clinic and go to the ED for active bleeding. - aspirin EC 81 MG tablet; Take 1 tablet (81 mg total) by mouth daily.  Dispense: 30 tablet; Refill: 3 - atorvastatin (LIPITOR) 40 MG tablet; Take 1 tablet (40 mg total) by mouth daily at 6 PM.  Dispense: 30 tablet; Refill: 3 - clopidogrel (PLAVIX) 75 MG tablet; Take 1 tablet (75 mg total) by mouth daily.  Dispense: 30 tablet; Refill: 3  2. Prediabetes - Her HgbA1c was 5.8%, she was advised to continue on low carb/ non concentrated sweet diet. - Basic Metabolic Panel (BMET); Future - HgB A1c; Future  3. Hospital  discharge follow-up - Will recheck - Basic Metabolic Panel (BMET); Future  4. Leukopenia, unspecified type - Her WBC was 2.7 and was advised to monitor and notify clinic for signs and symptoms of infection. Will recheck - CBC w/Diff; Future     Follow-up: Return  in about 12 weeks (around 07/10/2019), or if symptoms worsen or fail to improve.    Chinonso Linker Trellis PaganiniE Takesha Steger, NP

## 2019-04-17 NOTE — Patient Instructions (Signed)
Carbohydrate Counting for Diabetes Mellitus, Adult  Carbohydrate counting is a method of keeping track of how many carbohydrates you eat. Eating carbohydrates naturally increases the amount of sugar (glucose) in the blood. Counting how many carbohydrates you eat helps keep your blood glucose within normal limits, which helps you manage your diabetes (diabetes mellitus). It is important to know how many carbohydrates you can safely have in each meal. This is different for every person. A diet and nutrition specialist (registered dietitian) can help you make a meal plan and calculate how many carbohydrates you should have at each meal and snack. Carbohydrates are found in the following foods:  Grains, such as breads and cereals.  Dried beans and soy products.  Starchy vegetables, such as potatoes, peas, and corn.  Fruit and fruit juices.  Milk and yogurt.  Sweets and snack foods, such as cake, cookies, candy, chips, and soft drinks. How do I count carbohydrates? There are two ways to count carbohydrates in food. You can use either of the methods or a combination of both. Reading "Nutrition Facts" on packaged food The "Nutrition Facts" list is included on the labels of almost all packaged foods and beverages in the [place] It includes:  The serving size.  Information about nutrients in each serving, including the grams (g) of carbohydrate per serving. To use the "Nutrition Facts":  Decide how many servings you will have.  Multiply the number of servings by the number of carbohydrates per serving.  The resulting number is the total amount of carbohydrates that you will be having. Learning standard serving sizes of other foods When you eat carbohydrate foods that are not packaged or do not include "Nutrition Facts" on the label, you need to measure the servings in order to count the amount of carbohydrates:  Measure the foods that you will eat with a food scale or measuring cup, if needed.   Decide how many standard-size servings you will eat.  Multiply the number of servings by 15. Most carbohydrate-rich foods have about 15 g of carbohydrates per serving. ? For example, if you eat 8 oz (170 g) of strawberries, you will have eaten 2 servings and 30 g of carbohydrates (2 servings x 15 g = 30 g).  For foods that have more than one food mixed, such as soups and casseroles, you must count the carbohydrates in each food that is included. The following list contains standard serving sizes of common carbohydrate-rich foods. Each of these servings has about 15 g of carbohydrates:   hamburger bun or  English muffin.   oz (15 mL) syrup.   oz (14 g) jelly.  1 slice of bread.  1 six-inch tortilla.  3 oz (85 g) cooked rice or pasta.  4 oz (113 g) cooked dried beans.  4 oz (113 g) starchy vegetable, such as peas, corn, or potatoes.  4 oz (113 g) hot cereal.  4 oz (113 g) mashed potatoes or  of a large baked potato.  4 oz (113 g) canned or frozen fruit.  4 oz (120 mL) fruit juice.  4-6 crackers.  6 chicken nuggets.  6 oz (170 g) unsweetened dry cereal.  6 oz (170 g) plain fat-free yogurt or yogurt sweetened with artificial sweeteners.  8 oz (240 mL) milk.  8 oz (170 g) fresh fruit or one small piece of fruit.  24 oz (680 g) popped popcorn. Example of carbohydrate counting Sample meal  3 oz (85 g) chicken breast.  6 oz (170 g)   brown rice.  4 oz (113 g) corn.  8 oz (240 mL) milk.  8 oz (170 g) strawberries with sugar-free whipped topping. Carbohydrate calculation 1. Identify the foods that contain carbohydrates: ? Rice. ? Corn. ? Milk. ? Strawberries. 2. Calculate how many servings you have of each food: ? 2 servings rice. ? 1 serving corn. ? 1 serving milk. ? 1 serving strawberries. 3. Multiply each number of servings by 15 g: ? 2 servings rice x 15 g = 30 g. ? 1 serving corn x 15 g = 15 g. ? 1 serving milk x 15 g = 15 g. ? 1 serving  strawberries x 15 g = 15 g. 4. Add together all of the amounts to find the total grams of carbohydrates eaten: ? 30 g + 15 g + 15 g + 15 g = 75 g of carbohydrates total. Summary  Carbohydrate counting is a method of keeping track of how many carbohydrates you eat.  Eating carbohydrates naturally increases the amount of sugar (glucose) in the blood.  Counting how many carbohydrates you eat helps keep your blood glucose within normal limits, which helps you manage your diabetes.  A diet and nutrition specialist (registered dietitian) can help you make a meal plan and calculate how many carbohydrates you should have at each meal and snack. This information is not intended to replace advice given to you by your health care provider. Make sure you discuss any questions you have with your health care provider. Document Released: 06/07/2005 Document Revised: 12/30/2016 Document Reviewed: 11/19/2015 Elsevier Patient Education  2020 Elsevier Inc.  

## 2019-04-18 ENCOUNTER — Inpatient Hospital Stay: Payer: Self-pay | Admitting: Oncology

## 2019-04-18 ENCOUNTER — Telehealth: Payer: Self-pay | Admitting: Gerontology

## 2019-04-18 DIAGNOSIS — D72819 Decreased white blood cell count, unspecified: Secondary | ICD-10-CM | POA: Insufficient documentation

## 2019-04-26 ENCOUNTER — Other Ambulatory Visit: Payer: Self-pay

## 2019-04-26 ENCOUNTER — Ambulatory Visit: Payer: Self-pay | Admitting: Gerontology

## 2019-04-26 ENCOUNTER — Encounter: Payer: Self-pay | Admitting: Gerontology

## 2019-04-26 VITALS — BP 119/62 | HR 63 | Temp 96.4°F | Ht 65.0 in | Wt 145.0 lb

## 2019-04-26 DIAGNOSIS — T148XXA Other injury of unspecified body region, initial encounter: Secondary | ICD-10-CM

## 2019-04-26 DIAGNOSIS — R7303 Prediabetes: Secondary | ICD-10-CM

## 2019-04-26 DIAGNOSIS — Z789 Other specified health status: Secondary | ICD-10-CM | POA: Insufficient documentation

## 2019-04-26 NOTE — Progress Notes (Signed)
Established Patient Office Visit  Subjective:  Patient ID: Nancy Murray, female    DOB: [birth year]  Age: 63 y.o. MRN: [id number]  CC:  Chief Complaint  Patient presents with  . Mass    on r leg, first noticed about 2 weeks ago, no tender, no redness or hot to touch, originally dark in color    HPI Nancy Murray presents for follow up of a pea sized non tender, mobile nodule that's surrounded by a bruised area that measures approximately 5 cm x 5 cm bruises to the posterior aspect of her right knee. She denies injury, states that the bruises is lighting up. She denies hematuria, hematochezia or active bleeding, and  continues to take Aspirin 81 mg and Clopidogrel 75 mg daily. She states that she needs information with regards to Medicare application requirements. Her HgbA1c done on 03/19/2019 was 5.8%, she states that she has made dietary changes and  exercising as tolerated. She states that she's doing well and offers no further complaint.  Past Medical History:  Diagnosis Date  . Allergy   . Blood transfusion without reported diagnosis   . Clotting disorder ([place])   . History of ITP 1986  . Hyperlipidemia 07/04/2018  . Stroke [facility]) 03/18/2019    Past Surgical History:  Procedure Laterality Date  . ABDOMINAL HYSTERECTOMY    . CESAREAN SECTION    . LASER ABLATION OF THE CERVIX    . NOSE SURGERY      No family history on file.  Social History   Socioeconomic History  . Marital status: Married    Spouse name: Not on file  . Number of children: Not on file  . Years of education: Not on file  . Highest education level: Not on file  Occupational History  . Not on file  Social Needs  . Financial resource strain: Somewhat hard  . Food insecurity    Worry: Never true    Inability: Never true  . Transportation needs    Medical: Yes    Non-medical: Yes  Tobacco Use  . Smoking status: Never Smoker  . Smokeless tobacco: Never Used  Substance and Sexual Activity  .  Alcohol use: Not Currently  . Drug use: Never  . Sexual activity: Not on file  Lifestyle  . Physical activity    Days per week: 7 days    Minutes per session: 30 min  . Stress: To some extent  Relationships  . Social Herbalist on phone: Never    Gets together: Once a week    Attends religious service: 1 to 4 times per year    Active member of club or organization: Yes    Attends meetings of clubs or organizations: 1 to 4 times per year    Relationship status: Married  . Intimate partner violence    Fear of current or ex partner: No    Emotionally abused: No    Physically abused: No    Forced sexual activity: No  Other Topics Concern  . Not on file  Social History Narrative  . Not on file    Outpatient Medications Prior to Visit  Medication Sig Dispense Refill  . aspirin EC 81 MG tablet Take 1 tablet (81 mg total) by mouth daily. 30 tablet 3  . atorvastatin (LIPITOR) 40 MG tablet Take 1 tablet (40 mg total) by mouth daily at 6 PM. 30 tablet 3  . clopidogrel (PLAVIX) 75 MG tablet Take 1 tablet (75 mg  total) by mouth daily. 30 tablet 3   No facility-administered medications prior to visit.     Allergies  Allergen Reactions  . Cefizox [Ceftizoxime] Hives and Shortness Of Breath  . Biaxin [Clarithromycin] Hives    ROS Review of Systems  Constitutional: Negative.   Respiratory: Negative.   Cardiovascular: Negative.   Musculoskeletal: Negative.   Neurological: Negative.   Hematological: Bruises/bleeds easily.  Psychiatric/Behavioral: Negative.       Objective:    Physical Exam  Constitutional: She is oriented to person, place, and time. She appears well-developed.  HENT:  Head: Normocephalic and atraumatic.  Cardiovascular: Normal rate and regular rhythm.  Pulmonary/Chest: Effort normal and breath sounds normal.  Neurological: She is alert and oriented to person, place, and time.  Skin: Skin is warm and dry.     Psychiatric: She has a normal mood  and affect. Her behavior is normal. Judgment and thought content normal.    BP 119/62 (BP Location: Left Arm, Patient Position: Sitting)   Pulse 63   Temp (!) 96.4 F (35.8 C)   Ht 5\' 5"  (1.651 m)   Wt 145 lb (65.8 kg)   LMP 07/04/2005   SpO2 100%   BMI 24.13 kg/m  Wt Readings from Last 3 Encounters:  04/26/19 145 lb (65.8 kg)  04/17/19 149 lb (67.6 kg)  03/18/19 150 lb 3.2 oz (68.1 kg)     Health Maintenance Due  Topic Date Due  . PAP SMEAR-Modifier  10/18/1975    There are no preventive care reminders to display for this patient.  Lab Results  Component Value Date   TSH 0.958 06/01/2018   Lab Results  Component Value Date   WBC 2.7 (L) 04/12/2019   HGB 11.7 04/12/2019   HCT 34.8 04/12/2019   MCV 86 04/12/2019   PLT 242 04/12/2019   Lab Results  Component Value Date   NA 141 03/19/2019   K 4.1 03/19/2019   CO2 27 03/19/2019   GLUCOSE 100 (H) 03/19/2019   BUN 22 03/19/2019   CREATININE 1.05 (H) 03/19/2019   BILITOT 1.0 03/18/2019   ALKPHOS 100 03/18/2019   AST 27 03/18/2019   ALT 25 03/18/2019   PROT 7.6 03/18/2019   ALBUMIN 4.7 03/18/2019   CALCIUM 9.5 03/19/2019   ANIONGAP 7 03/19/2019   Lab Results  Component Value Date   CHOL 115 04/12/2019   Lab Results  Component Value Date   HDL 43 04/12/2019   Lab Results  Component Value Date   LDLCALC 59 04/12/2019   Lab Results  Component Value Date   TRIG 57 04/12/2019   Lab Results  Component Value Date   CHOLHDL 2.7 04/12/2019   Lab Results  Component Value Date   HGBA1C 5.8 (H) 03/19/2019      Assessment & Plan:     1. Bruise - She was advised to notify clinic if pea sized nodule increases in size, tender and if bruises fail to resolve. She was advised to notify clinic for hematuria, hematochezia and go to the ED for active bleeding.  2. Needs assistance with community resources - She will follow up with Ms. Simpson LCSW .  3. Prediabetes - Her HgbA1c was 5.8%, she deferred  Metformin therapy. She was advised to continue on low carb/non concentrated sweet diet, exercise as tolerated   Follow-up: Return in about 2 months (around 07/10/2019), or if symptoms worsen or fail to improve.    Jyllian Haynie 07/12/2019, NP

## 2019-04-26 NOTE — Patient Instructions (Signed)
Carbohydrate Counting for Diabetes Mellitus, Adult  Carbohydrate counting is a method of keeping track of how many carbohydrates you eat. Eating carbohydrates naturally increases the amount of sugar (glucose) in the blood. Counting how many carbohydrates you eat helps keep your blood glucose within normal limits, which helps you manage your diabetes (diabetes mellitus). It is important to know how many carbohydrates you can safely have in each meal. This is different for every person. A diet and nutrition specialist (registered dietitian) can help you make a meal plan and calculate how many carbohydrates you should have at each meal and snack. Carbohydrates are found in the following foods:  Grains, such as breads and cereals.  Dried beans and soy products.  Starchy vegetables, such as potatoes, peas, and corn.  Fruit and fruit juices.  Milk and yogurt.  Sweets and snack foods, such as cake, cookies, candy, chips, and soft drinks. How do I count carbohydrates? There are two ways to count carbohydrates in food. You can use either of the methods or a combination of both. Reading "Nutrition Facts" on packaged food The "Nutrition Facts" list is included on the labels of almost all packaged foods and beverages in the [place] It includes:  The serving size.  Information about nutrients in each serving, including the grams (g) of carbohydrate per serving. To use the "Nutrition Facts":  Decide how many servings you will have.  Multiply the number of servings by the number of carbohydrates per serving.  The resulting number is the total amount of carbohydrates that you will be having. Learning standard serving sizes of other foods When you eat carbohydrate foods that are not packaged or do not include "Nutrition Facts" on the label, you need to measure the servings in order to count the amount of carbohydrates:  Measure the foods that you will eat with a food scale or measuring cup, if needed.   Decide how many standard-size servings you will eat.  Multiply the number of servings by 15. Most carbohydrate-rich foods have about 15 g of carbohydrates per serving. ? For example, if you eat 8 oz (170 g) of strawberries, you will have eaten 2 servings and 30 g of carbohydrates (2 servings x 15 g = 30 g).  For foods that have more than one food mixed, such as soups and casseroles, you must count the carbohydrates in each food that is included. The following list contains standard serving sizes of common carbohydrate-rich foods. Each of these servings has about 15 g of carbohydrates:   hamburger bun or  English muffin.   oz (15 mL) syrup.   oz (14 g) jelly.  1 slice of bread.  1 six-inch tortilla.  3 oz (85 g) cooked rice or pasta.  4 oz (113 g) cooked dried beans.  4 oz (113 g) starchy vegetable, such as peas, corn, or potatoes.  4 oz (113 g) hot cereal.  4 oz (113 g) mashed potatoes or  of a large baked potato.  4 oz (113 g) canned or frozen fruit.  4 oz (120 mL) fruit juice.  4-6 crackers.  6 chicken nuggets.  6 oz (170 g) unsweetened dry cereal.  6 oz (170 g) plain fat-free yogurt or yogurt sweetened with artificial sweeteners.  8 oz (240 mL) milk.  8 oz (170 g) fresh fruit or one small piece of fruit.  24 oz (680 g) popped popcorn. Example of carbohydrate counting Sample meal  3 oz (85 g) chicken breast.  6 oz (170 g)   brown rice.  4 oz (113 g) corn.  8 oz (240 mL) milk.  8 oz (170 g) strawberries with sugar-free whipped topping. Carbohydrate calculation 1. Identify the foods that contain carbohydrates: ? Rice. ? Corn. ? Milk. ? Strawberries. 2. Calculate how many servings you have of each food: ? 2 servings rice. ? 1 serving corn. ? 1 serving milk. ? 1 serving strawberries. 3. Multiply each number of servings by 15 g: ? 2 servings rice x 15 g = 30 g. ? 1 serving corn x 15 g = 15 g. ? 1 serving milk x 15 g = 15 g. ? 1 serving  strawberries x 15 g = 15 g. 4. Add together all of the amounts to find the total grams of carbohydrates eaten: ? 30 g + 15 g + 15 g + 15 g = 75 g of carbohydrates total. Summary  Carbohydrate counting is a method of keeping track of how many carbohydrates you eat.  Eating carbohydrates naturally increases the amount of sugar (glucose) in the blood.  Counting how many carbohydrates you eat helps keep your blood glucose within normal limits, which helps you manage your diabetes.  A diet and nutrition specialist (registered dietitian) can help you make a meal plan and calculate how many carbohydrates you should have at each meal and snack. This information is not intended to replace advice given to you by your health care provider. Make sure you discuss any questions you have with your health care provider. Document Released: 06/07/2005 Document Revised: 12/30/2016 Document Reviewed: 11/19/2015 Elsevier Patient Education  2020 Elsevier Inc.  

## 2019-05-03 ENCOUNTER — Other Ambulatory Visit: Payer: Self-pay

## 2019-05-03 ENCOUNTER — Ambulatory Visit: Payer: Self-pay | Admitting: Licensed Clinical Social Worker

## 2019-05-03 DIAGNOSIS — Z789 Other specified health status: Secondary | ICD-10-CM

## 2019-05-03 NOTE — BH Specialist Note (Signed)
Clinician provided the patient an overview of Medicare Part A, B, C, & D. She explained to the patient that she would put together a checklist for applying for medicare along with contact information to speak to someone at [facility] [employer] to answer any questions. She placed the packed in the mail on 05/03/2019 to send to the patient.

## 2019-05-25 ENCOUNTER — Telehealth: Payer: Self-pay | Admitting: Gerontology

## 2019-05-25 NOTE — Telephone Encounter (Signed)
Ms. Graw was called,she states that she experienced an episode of blood on the tissue paper after blowing her nose 3 days ago. She denies rhinorrhea, sinus pressure and upper respiratory symptoms. She states that she's doing well, and was advised to notify clinic with any reoccurrence.

## 2019-07-05 ENCOUNTER — Other Ambulatory Visit: Payer: Self-pay

## 2019-07-05 ENCOUNTER — Telehealth: Payer: Self-pay | Admitting: Pharmacy Technician

## 2019-07-05 ENCOUNTER — Ambulatory Visit: Payer: Self-pay | Admitting: Ophthalmology

## 2019-07-05 DIAGNOSIS — R7303 Prediabetes: Secondary | ICD-10-CM

## 2019-07-05 DIAGNOSIS — D72819 Decreased white blood cell count, unspecified: Secondary | ICD-10-CM

## 2019-07-05 DIAGNOSIS — Z09 Encounter for follow-up examination after completed treatment for conditions other than malignant neoplasm: Secondary | ICD-10-CM

## 2019-07-05 NOTE — Telephone Encounter (Signed)
Patient failed to provide requested 2020 financial documentation. No additional medication assistance will be provided by [facility] without the required proof of income documentation. Patient notified by letter  Navneet Schmuck CPhT [facility] 

## 2019-07-06 LAB — CBC WITH DIFFERENTIAL/PLATELET
Basophils Absolute: 0 10*3/uL (ref 0.0–0.2)
Basos: 1 %
EOS (ABSOLUTE): 0.2 10*3/uL (ref 0.0–0.4)
Eos: 5 %
Hematocrit: 37.9 % (ref 34.0–46.6)
Hemoglobin: 12.3 g/dL (ref 11.1–15.9)
Immature Grans (Abs): 0 10*3/uL (ref 0.0–0.1)
Immature Granulocytes: 0 %
Lymphocytes Absolute: 0.9 10*3/uL (ref 0.7–3.1)
Lymphs: 24 %
MCH: 29.5 pg (ref 26.6–33.0)
MCHC: 32.5 g/dL (ref 31.5–35.7)
MCV: 91 fL (ref 79–97)
Monocytes Absolute: 0.3 10*3/uL (ref 0.1–0.9)
Monocytes: 9 %
Neutrophils Absolute: 2.2 10*3/uL (ref 1.4–7.0)
Neutrophils: 61 %
Platelets: 213 10*3/uL (ref 150–450)
RBC: 4.17 x10E6/uL (ref 3.77–5.28)
RDW: 13.9 % (ref 11.7–15.4)
WBC: 3.6 10*3/uL (ref 3.4–10.8)

## 2019-07-06 LAB — BASIC METABOLIC PANEL
BUN/Creatinine Ratio: 29 — ABNORMAL HIGH (ref 12–28)
BUN: 31 mg/dL — ABNORMAL HIGH (ref 8–27)
CO2: 25 mmol/L (ref 20–29)
Calcium: 9.7 mg/dL (ref 8.7–10.3)
Chloride: 99 mmol/L (ref 96–106)
Creatinine, Ser: 1.07 mg/dL — ABNORMAL HIGH (ref 0.57–1.00)
GFR calc Af Amer: 63 mL/min/{1.73_m2} (ref >59)
GFR calc non Af Amer: 55 mL/min/{1.73_m2} — ABNORMAL LOW (ref >59)
Glucose: 88 mg/dL (ref 65–99)
Potassium: 4.6 mmol/L (ref 3.5–5.2)
Sodium: 138 mmol/L (ref 134–144)

## 2019-07-06 LAB — HEMOGLOBIN A1C
Est. average glucose Bld gHb Est-mCnc: 108 mg/dL
Hgb A1c MFr Bld: 5.4 % (ref 4.8–5.6)

## 2019-07-10 ENCOUNTER — Encounter: Payer: Self-pay | Admitting: Diagnostic Neuroimaging

## 2019-07-10 ENCOUNTER — Ambulatory Visit (INDEPENDENT_AMBULATORY_CARE_PROVIDER_SITE_OTHER): Payer: Self-pay | Admitting: Diagnostic Neuroimaging

## 2019-07-10 ENCOUNTER — Other Ambulatory Visit: Payer: Self-pay

## 2019-07-10 VITALS — BP 111/79 | HR 59 | Temp 96.8°F | Ht 65.0 in | Wt 144.2 lb

## 2019-07-10 DIAGNOSIS — I63412 Cerebral infarction due to embolism of left middle cerebral artery: Secondary | ICD-10-CM

## 2019-07-10 DIAGNOSIS — Z862 Personal history of diseases of the blood and blood-forming organs and certain disorders involving the immune mechanism: Secondary | ICD-10-CM

## 2019-07-10 NOTE — Patient Instructions (Signed)
STROKE  - continue aspirin 81mg  daily and atorvastatin 40mg  daily - stop plavix (3 months completed)

## 2019-07-10 NOTE — Progress Notes (Signed)
[facility]  PATIENT: Nancy Murray DOB: Jul 26, 1955  REFERRING CLINICIAN: Iloabachie, Chioma E, NP HISTORY FROM: patient  REASON FOR VISIT: new consult    HISTORICAL  CHIEF COMPLAINT:  Chief Complaint  Patient presents with  . Cerebrovascular Accident    rm 6 New Pt    HISTORY OF PRESENT ILLNESS:    64 year old female here for evaluation of stroke.  September 2020 patient had onset of right hand numbness, weakness, slurred speech.  Patient went to the hospital for evaluation and was diagnosed with small left parietal and frontal ischemic infarction with small hemorrhagic transformation.  Patient has history of ITP and hyperlipidemia.  Patient was treated with aspirin and Plavix.  She was found to have severe left M2 and left P2 intracranial stenosis.  Since that time symptoms have improved.  She is back to baseline.  She is tolerating medications.  She has improved her nutrition and exercise.   REVIEW OF SYSTEMS: Full 14 system review of systems performed and negative with exception of: As per HPI.  ALLERGIES: Allergies  Allergen Reactions  . Cefizox [Ceftizoxime] Hives and Shortness Of Breath  . Biaxin [Clarithromycin] Hives    HOME MEDICATIONS: Outpatient Medications Prior to Visit  Medication Sig Dispense Refill  . aspirin EC 81 MG tablet Take 81 mg by mouth daily.    Marland Kitchen atorvastatin (LIPITOR) 40 MG tablet Take 1 tablet (40 mg total) by mouth daily at 6 PM. 30 tablet 3  . clopidogrel (PLAVIX) 75 MG tablet Take 75 mg by mouth daily.     No facility-administered medications prior to visit.    PAST MEDICAL HISTORY: Past Medical History:  Diagnosis Date  . Allergy   . Blood transfusion without reported diagnosis   . Clotting disorder ([facility])   . History of ITP 1986  . Hyperlipidemia 07/04/2018  . Stroke [facility]) 03/18/2019    PAST SURGICAL HISTORY: Past Surgical History:  Procedure Laterality Date  . CESAREAN SECTION  1988  . LASER ABLATION  OF THE CERVIX    . NOSE SURGERY  1984  . TUBAL LIGATION  1985   then reversed in 1986  . TUBAL LIGATION     #2 1989    FAMILY HISTORY: Family History  Problem Relation Age of Onset  . Cirrhosis Mother   . Diabetes Sister   . Cancer Sister   . Cancer Maternal Aunt   . Diabetes Maternal Uncle   . Stroke Maternal Uncle   . Multiple sclerosis Daughter   . Seizures Son   . Bipolar disorder Son   . Graves' disease Son     SOCIAL HISTORY: Social History   Socioeconomic History  . Marital status: Married    Spouse name: Rayna Sexton  . Number of children: 4  . Years of education: 22  . Highest education level: Not on file  Occupational History    Comment: retired  Tobacco Use  . Smoking status: Never Smoker  . Smokeless tobacco: Never Used  Substance and Sexual Activity  . Alcohol use: Not Currently    Comment: very seldom  . Drug use: Not Currently    Comment: very seldom  . Sexual activity: Not on file  Other Topics Concern  . Not on file  Social History Narrative   Lives with spouse   Caffeine- green tea   Social Determinants of Health   Financial Resource Strain:   . Difficulty of Paying Living Expenses: Not on file  Food Insecurity:   . Worried About  Running Out of Food in the Last Year: Not on file  . Ran Out of Food in the Last Year: Not on file  Transportation Needs:   . Lack of Transportation (Medical): Not on file  . Lack of Transportation (Non-Medical): Not on file  Physical Activity:   . Days of Exercise per Week: Not on file  . Minutes of Exercise per Session: Not on file  Stress:   . Feeling of Stress : Not on file  Social Connections:   . Frequency of Communication with Friends and Family: Not on file  . Frequency of Social Gatherings with Friends and Family: Not on file  . Attends Religious Services: Not on file  . Active Member of Clubs or Organizations: Not on file  . Attends Banker Meetings: Not on file  . Marital Status: Not on  file  Intimate Partner Violence:   . Fear of Current or Ex-Partner: Not on file  . Emotionally Abused: Not on file  . Physically Abused: Not on file  . Sexually Abused: Not on file     PHYSICAL EXAM  GENERAL EXAM/CONSTITUTIONAL: Vitals:  Vitals:   07/10/19 1553  BP: 111/79  Pulse: (!) 59  Temp: (!) 96.8 F (36 C)  Weight: 144 lb 3.2 oz (65.4 kg)  Height: 5\' 5"  (1.651 m)   Body mass index is 24 kg/m. Wt Readings from Last 3 Encounters:  07/10/19 144 lb 3.2 oz (65.4 kg)  04/26/19 145 lb (65.8 kg)  04/17/19 149 lb (67.6 kg)    Patient is in no distress; well developed, nourished and groomed; neck is supple  CARDIOVASCULAR:  Examination of carotid arteries is normal; no carotid bruits  Regular rate and rhythm, no murmurs  Examination of peripheral vascular system by observation and palpation is normal  EYES:  Ophthalmoscopic exam of optic discs and posterior segments is normal; no papilledema or hemorrhages No exam data present  MUSCULOSKELETAL:  Gait, strength, tone, movements noted in Neurologic exam below  NEUROLOGIC: MENTAL STATUS:  No flowsheet data found.  awake, alert, oriented to person, place and time  recent and remote memory intact  normal attention and concentration  language fluent, comprehension intact, naming intact  fund of knowledge appropriate  CRANIAL NERVE:   2nd - no papilledema on fundoscopic exam  2nd, 3rd, 4th, 6th - pupils equal and reactive to light, visual fields full to confrontation, extraocular muscles intact, no nystagmus  5th - facial sensation symmetric  7th - facial strength symmetric  8th - hearing intact  9th - palate elevates symmetrically, uvula midline  11th - shoulder shrug symmetric  12th - tongue protrusion midline  MOTOR:   normal bulk and tone, full strength in the BUE, BLE  SENSORY:   normal and symmetric to light touch, temperature, vibration  COORDINATION:   finger-nose-finger, fine  finger movements normal  REFLEXES:   deep tendon reflexes present and symmetric  GAIT/STATION:   narrow based gait     DIAGNOSTIC DATA (LABS, IMAGING, TESTING) - I reviewed patient records, labs, notes, testing and imaging myself where available.  Lab Results  Component Value Date   WBC 3.6 07/05/2019   HGB 12.3 07/05/2019   HCT 37.9 07/05/2019   MCV 91 07/05/2019   PLT 213 07/05/2019      Component Value Date/Time   NA 138 07/05/2019 1806   NA 144 07/07/2011 0950   K 4.6 07/05/2019 1806   K 4.1 07/07/2011 0950   CL 99 07/05/2019 1806  CL 105 07/07/2011 0950   CO2 25 07/05/2019 1806   CO2 29 07/07/2011 0950   GLUCOSE 88 07/05/2019 1806   GLUCOSE 100 (H) 03/19/2019 0415   GLUCOSE 80 07/07/2011 0950   BUN 31 (H) 07/05/2019 1806   BUN 21 (H) 07/07/2011 0950   CREATININE 1.07 (H) 07/05/2019 1806   CREATININE 0.90 07/07/2011 0950   CALCIUM 9.7 07/05/2019 1806   CALCIUM 9.6 07/07/2011 0950   PROT 7.6 03/18/2019 1158   PROT 6.6 08/29/2018 1818   PROT 7.6 07/07/2011 0950   ALBUMIN 4.7 03/18/2019 1158   ALBUMIN 4.4 08/29/2018 1818   ALBUMIN 4.3 07/07/2011 0950   AST 27 03/18/2019 1158   AST 26 07/07/2011 0950   ALT 25 03/18/2019 1158   ALT 27 07/07/2011 0950   ALKPHOS 100 03/18/2019 1158   ALKPHOS 94 07/07/2011 0950   BILITOT 1.0 03/18/2019 1158   BILITOT 0.2 08/29/2018 1818   BILITOT 0.5 07/07/2011 0950   GFRNONAA 55 (L) 07/05/2019 1806   GFRNONAA >60 07/07/2011 0950   GFRAA 63 07/05/2019 1806   GFRAA >60 07/07/2011 0950   Lab Results  Component Value Date   CHOL 115 04/12/2019   HDL 43 04/12/2019   LDLCALC 59 04/12/2019   TRIG 57 04/12/2019   CHOLHDL 2.7 04/12/2019   Lab Results  Component Value Date   HGBA1C 5.4 07/05/2019   No results found for: [id number] Lab Results  Component Value Date   TSH 0.958 06/01/2018    03/18/19 MRI brain [I reviewed images myself and agree with interpretation. -VRP]  - Small areas of acute infarct in the left  posterior frontal and parietal lobe. Small area of hemorrhage in the left parietal operculum infarct. No mass-effect or midline shift. No significant chronic ischemia.  03/19/19 CTA head / neck [I reviewed images myself and agree with interpretation. -VRP]  1. No large vessel occlusion. 2. Severe proximal left M2 stenosis. 3. Moderate proximal left P2 PCA stenoses. 4. Widely patent cervical carotid and vertebral arteries.  03/18/19 TTE  1. Left ventricular ejection fraction, by visual estimation, is 55 to 60%. The left ventricle has normal function. Normal left ventricular size. There is no left ventricular hypertrophy.  2. Left ventricular diastolic Doppler parameters are consistent with impaired relaxation pattern of LV diastolic filling.  3. Global right ventricle has normal systolic function.The right ventricular size is normal. No increase in right ventricular wall thickness.  4. Left atrial size was normal.  5. Right atrial size was normal.  6. The mitral valve is normal in structure. Mild to moderate mitral valve regurgitation. No evidence of mitral stenosis.  7. The tricuspid valve is normal in structure. Tricuspid valve regurgitation moderate.  8. The aortic valve is normal in structure. Aortic valve regurgitation was not visualized by color flow Doppler. Structurally normal aortic valve, with no evidence of sclerosis or stenosis.  9. The pulmonic valve was normal in structure. Pulmonic valve regurgitation is mild by color flow Doppler. 10. Normal pulmonary artery systolic pressure. 11. The inferior vena cava is normal in size with greater than 50% respiratory variability, suggesting right atrial pressure of 3 mmHg. 12. No intracardiac thrombi or masses were visualized.  03/18/19 carotid u/s - Color duplex indicates minimal heterogeneous and calcified plaque, with no hemodynamically significant stenosis by duplex criteria in the extracranial cerebrovascular circulation.      ASSESSMENT AND PLAN  64 y.o. year old female here with:  Dx:  1. Cerebrovascular accident (CVA) due to embolism of left  middle cerebral artery (HCC)   2. History of ITP     PLAN:  STROKE (due to intracranial stenosis; hypercholesterolemia) - continue aspirin 81mg  daily and atorvastatin 40mg  daily - stop plavix (3 months completed)  Return for return to PCP, pending if symptoms worsen or fail to improve.    , MD 07/10/2019, 4:38 PM Certified in Neurology, Neurophysiology and Neuroimaging  [facility] [address] [place], [address] [place] [phone]

## 2019-07-12 ENCOUNTER — Ambulatory Visit: Payer: Self-pay | Admitting: Gerontology

## 2019-07-12 ENCOUNTER — Ambulatory Visit: Payer: Self-pay | Admitting: Family Medicine

## 2019-07-12 ENCOUNTER — Encounter: Payer: Self-pay | Admitting: Family Medicine

## 2019-07-12 DIAGNOSIS — D689 Coagulation defect, unspecified: Secondary | ICD-10-CM

## 2019-07-12 DIAGNOSIS — E782 Mixed hyperlipidemia: Secondary | ICD-10-CM

## 2019-07-12 DIAGNOSIS — I639 Cerebral infarction, unspecified: Secondary | ICD-10-CM

## 2019-07-12 MED ORDER — ATORVASTATIN CALCIUM 40 MG PO TABS: 40.0000 mg | ORAL_TABLET | Freq: Every day | ORAL | 2 refills | Status: DC

## 2019-07-12 NOTE — Progress Notes (Signed)
    Virtual Visit via Telephone Note  I connected with Nancy Murray on 07/12/19 at  7:15 PM EST by telephone and verified that I am speaking with the correct person using two identifiers.   I discussed the limitations, risks, security and privacy concerns of performing an evaluation and management service by telephone and the availability of in person appointments. I also discussed with the patient that there may be a patient responsible charge related to this service. The patient expressed understanding and agreed to proceed.   History of Present Illness: Nancy Murray  has a past medical history of Allergy, Blood transfusion without reported diagnosis, Clotting disorder (HCC), History of ITP (1986), Hyperlipidemia (07/04/2018), and Stroke (HCC) (03/18/2019). Patient followed by neurology for history of stroke on 02/2019. Patient instructed to continue with asa 81mg  and lipitor 40 mg. She has finished her course of Plavix. Patient states that she is having difficulty with affording medications and has to pick up the application to medication management to qualify. She states that she has less than 1 week of medication left.    Observations/Objective: Patient with regular voice tone, rate and rhythm. Speaking calmly and is in no apparent distress.    Assessment and Plan: 1. Cerebrovascular accident (CVA), unspecified mechanism (HCC) - atorvastatin (LIPITOR) 40 MG tablet; Take 1 tablet (40 mg total) by mouth daily at 6 PM.  Dispense: 30 tablet; Refill: 2  2. Clotting disorder (HCC) Continue with Aspirin 81 mg  3. Mixed hyperlipidemia - atorvastatin (LIPITOR) 40 MG tablet; Take 1 tablet (40 mg total) by mouth daily at 6 PM.  Dispense: 30 tablet; Refill: 2  We discussed the need to continue with medication. Patient will get paperwork completed. Discussed other discount programs for medication.    Follow Up Instructions:  We discussed hand washing, using hand sanitizer when soap and  water are not available, only going out when absolutely necessary, and social distancing. Explained to patient that she is immunocompromised and will need to take precautions during this time.   I discussed the assessment and treatment plan with the patient. The patient was provided an opportunity to ask questions and all were answered. The patient agreed with the plan and demonstrated an understanding of the instructions.   The patient was advised to call back or seek an in-person evaluation if the symptoms worsen or if the condition fails to improve as anticipated.  I provided 21 minutes of non-face-to-face time during this encounter.  Ms. Andr L. , FNP-BC [facility] [address] [place], [place] [phone]

## 2019-07-13 ENCOUNTER — Telehealth: Payer: Self-pay

## 2019-07-13 NOTE — Telephone Encounter (Signed)
Left voicemail for patient. Patient needs to provide proof of income.  1 month pay stubs for husband and proof of IRA or pension benefits listed on 2018 and 2019 tax forms.

## 2019-07-20 ENCOUNTER — Ambulatory Visit: Payer: Self-pay | Admitting: Pharmacy Technician

## 2019-07-20 ENCOUNTER — Other Ambulatory Visit: Payer: Self-pay

## 2019-07-20 DIAGNOSIS — Z79899 Other long term (current) drug therapy: Secondary | ICD-10-CM

## 2019-07-20 NOTE — Progress Notes (Signed)
  Completed [facility] application and contract.  Patient verbally agreed to all terms of the [facility] contract.  Patient to come [facility] to sign patient intake application, [facility] attestation, patient advocate letter and [facility] contract.  Patient to provide social security award letter for 2021, January 2021 paystubs from spouse, verification of spouse's pension, January 2021 bank statements, utility bill, and 2019 tax return.    Patient is eligible for Medicare 09/20/19.  Patient provided contact information of agencies to reach out to regarding being screened for a Medicare Savings Plan and L.I.S.  Patient verbally acknowledged that she understands that no additional medication assistance will be provided after 09/20/19, when she is eligible to obtain prescription drug coverage through Medicare Part D.    Sherilyn Dacosta Care Manager [facility]

## 2019-07-24 ENCOUNTER — Telehealth: Payer: Self-pay | Admitting: Pharmacy Technician

## 2019-07-24 NOTE — Telephone Encounter (Signed)
Patient approved to receive medication assistance at [facility] until 09/20/19.  Patient is eligible for Medicare A, B & D starting 09/20/19.  Patient acknowledged that she understood [facility] will no longer be able to provide medication assistance beginning 09/20/19.  Patient made aware of how to be screened for a Medicare Savings Plan and L.I.S. (Low Income Subsidy).    Sherilyn Dacosta Care Manager [facility]

## 2019-08-10 ENCOUNTER — Other Ambulatory Visit: Payer: Self-pay | Admitting: Gerontology

## 2019-08-10 DIAGNOSIS — I639 Cerebral infarction, unspecified: Secondary | ICD-10-CM

## 2019-09-05 ENCOUNTER — Other Ambulatory Visit: Payer: Self-pay | Admitting: Gerontology

## 2019-09-12 ENCOUNTER — Other Ambulatory Visit: Payer: Self-pay | Admitting: Gerontology

## 2019-09-12 DIAGNOSIS — E782 Mixed hyperlipidemia: Secondary | ICD-10-CM

## 2019-09-12 DIAGNOSIS — I639 Cerebral infarction, unspecified: Secondary | ICD-10-CM

## 2019-09-20 ENCOUNTER — Telehealth: Payer: Self-pay | Admitting: Pharmacy Technician

## 2019-09-20 NOTE — Telephone Encounter (Signed)
Patient has Medicare effective 09/20/19.  No longer meets [facility]'s eligibility criteria.  Sherilyn Dacosta Care Manager [facility]   [address] [place], [place]  [address]     This is to inform you that you are no longer eligible to receive medication assistance at [facility].  The reason(s) are:    _____Your total gross monthly household income exceeds 250% of the Federal Poverty Level.   _____Tangible assets (savings, checking, stocks/bonds, pension, retirement, etc.) exceeds our limit  _____You are eligible to receive benefits from [facility], [facility] or HIV Medication            Assistance program __X__You are eligible to receive benefits from a Medicare Part "D" plan _____You have prescription insurance  _____You are not an [facility] resident _____Failure to provide all requested proof of income information for 2021.    We regret that we are unable to help you at this time.  If your prescription coverage is terminated, please contact [facility], so that we may reassess your eligibility for our program.  If you have questions, we may be contacted at [phone].  Thank you,  [facility]

## 2019-10-04 ENCOUNTER — Other Ambulatory Visit: Payer: Self-pay

## 2019-10-04 DIAGNOSIS — E782 Mixed hyperlipidemia: Secondary | ICD-10-CM

## 2019-10-05 LAB — LIPID PANEL WITH LDL/HDL RATIO
Cholesterol, Total: 268 mg/dL — ABNORMAL HIGH (ref 100–199)
HDL: 74 mg/dL (ref >39)
LDL Chol Calc (NIH): 183 mg/dL — ABNORMAL HIGH (ref 0–99)
LDL/HDL Ratio: 2.5 ratio (ref 0.0–3.2)
Triglycerides: 69 mg/dL (ref 0–149)
VLDL Cholesterol Cal: 11 mg/dL (ref 5–40)

## 2019-10-05 LAB — COMPREHENSIVE METABOLIC PANEL
ALT: 29 IU/L (ref 0–32)
AST: 32 IU/L (ref 0–40)
Albumin/Globulin Ratio: 1.8 (ref 1.2–2.2)
Albumin: 4.6 g/dL (ref 3.8–4.8)
Alkaline Phosphatase: 130 IU/L — ABNORMAL HIGH (ref 39–117)
BUN/Creatinine Ratio: 28 (ref 12–28)
BUN: 26 mg/dL (ref 8–27)
Bilirubin Total: 0.3 mg/dL (ref 0.0–1.2)
CO2: 26 mmol/L (ref 20–29)
Calcium: 9.8 mg/dL (ref 8.7–10.3)
Chloride: 102 mmol/L (ref 96–106)
Creatinine, Ser: 0.92 mg/dL (ref 0.57–1.00)
GFR calc Af Amer: 76 mL/min/{1.73_m2} (ref >59)
GFR calc non Af Amer: 66 mL/min/{1.73_m2} (ref >59)
Globulin, Total: 2.5 g/dL (ref 1.5–4.5)
Glucose: 76 mg/dL (ref 65–99)
Potassium: 4.5 mmol/L (ref 3.5–5.2)
Sodium: 140 mmol/L (ref 134–144)
Total Protein: 7.1 g/dL (ref 6.0–8.5)

## 2019-10-11 ENCOUNTER — Ambulatory Visit: Payer: Self-pay | Admitting: Family Medicine

## 2019-10-11 ENCOUNTER — Other Ambulatory Visit: Payer: Self-pay

## 2019-10-11 ENCOUNTER — Ambulatory Visit: Payer: Self-pay

## 2019-10-11 VITALS — Ht 65.0 in | Wt 142.0 lb

## 2019-10-11 DIAGNOSIS — E782 Mixed hyperlipidemia: Secondary | ICD-10-CM

## 2019-10-11 DIAGNOSIS — I639 Cerebral infarction, unspecified: Secondary | ICD-10-CM

## 2019-10-11 MED ORDER — ATORVASTATIN CALCIUM 40 MG PO TABS: ORAL_TABLET | ORAL | 0 refills | Status: DC

## 2019-10-11 NOTE — Patient Instructions (Signed)
Health Maintenance, Female Adopting a healthy lifestyle and getting preventive care are important in promoting health and wellness. Ask your health care provider about:  The right schedule for you to have regular tests and exams.  Things you can do on your own to prevent diseases and keep yourself healthy. What should I know about diet, weight, and exercise? Eat a healthy diet   Eat a diet that includes plenty of vegetables, fruits, low-fat dairy products, and lean protein.  Do not eat a lot of foods that are high in solid fats, added sugars, or sodium. Maintain a healthy weight Body mass index (BMI) is used to identify weight problems. It estimates body fat based on height and weight. Your health care provider can help determine your BMI and help you achieve or maintain a healthy weight. Get regular exercise Get regular exercise. This is one of the most important things you can do for your health. Most adults should:  Exercise for at least 150 minutes each week. The exercise should increase your heart rate and make you sweat (moderate-intensity exercise).  Do strengthening exercises at least twice a week. This is in addition to the moderate-intensity exercise.  Spend less time sitting. Even light physical activity can be beneficial. Watch cholesterol and blood lipids Have your blood tested for lipids and cholesterol at 65 years of age, then have this test every 5 years. Have your cholesterol levels checked more often if:  Your lipid or cholesterol levels are high.  You are older than 65 years of age.  You are at high risk for heart disease. What should I know about cancer screening? Depending on your health history and family history, you may need to have cancer screening at various ages. This may include screening for:  Breast cancer.  Cervical cancer.  Colorectal cancer.  Skin cancer.  Lung cancer. What should I know about heart disease, diabetes, and high blood  pressure? Blood pressure and heart disease  High blood pressure causes heart disease and increases the risk of stroke. This is more likely to develop in people who have high blood pressure readings, are of African descent, or are overweight.  Have your blood pressure checked: ? Every 3-5 years if you are 18-39 years of age. ? Every year if you are 40 years old or older. Diabetes Have regular diabetes screenings. This checks your fasting blood sugar level. Have the screening done:  Once every three years after age 40 if you are at a normal weight and have a low risk for diabetes.  More often and at a younger age if you are overweight or have a high risk for diabetes. What should I know about preventing infection? Hepatitis B If you have a higher risk for hepatitis B, you should be screened for this virus. Talk with your health care provider to find out if you are at risk for hepatitis B infection. Hepatitis C Testing is recommended for:  Everyone born from 1945 through 1965.  Anyone with known risk factors for hepatitis C. Sexually transmitted infections (STIs)  Get screened for STIs, including gonorrhea and chlamydia, if: ? You are sexually active and are younger than 65 years of age. ? You are older than 65 years of age and your health care provider tells you that you are at risk for this type of infection. ? Your sexual activity has changed since you were last screened, and you are at increased risk for chlamydia or gonorrhea. Ask your health care provider if   you are at risk.  Ask your health care provider about whether you are at high risk for HIV. Your health care provider may recommend a prescription medicine to help prevent HIV infection. If you choose to take medicine to prevent HIV, you should first get tested for HIV. You should then be tested every 3 months for as long as you are taking the medicine. Pregnancy  If you are about to stop having your period (premenopausal) and  you may become pregnant, seek counseling before you get pregnant.  Take 400 to 800 micrograms (mcg) of folic acid every day if you become pregnant.  Ask for birth control (contraception) if you want to prevent pregnancy. Osteoporosis and menopause Osteoporosis is a disease in which the bones lose minerals and strength with aging. This can result in bone fractures. If you are 65 years old or older, or if you are at risk for osteoporosis and fractures, ask your health care provider if you should:  Be screened for bone loss.  Take a calcium or vitamin D supplement to lower your risk of fractures.  Be given hormone replacement therapy (HRT) to treat symptoms of menopause. Follow these instructions at home: Lifestyle  Do not use any products that contain nicotine or tobacco, such as cigarettes, e-cigarettes, and chewing tobacco. If you need help quitting, ask your health care provider.  Do not use street drugs.  Do not share needles.  Ask your health care provider for help if you need support or information about quitting drugs. Alcohol use  Do not drink alcohol if: ? Your health care provider tells you not to drink. ? You are pregnant, may be pregnant, or are planning to become pregnant.  If you drink alcohol: ? Limit how much you use to 0-1 drink a day. ? Limit intake if you are breastfeeding.  Be aware of how much alcohol is in your drink. In the [place] one drink equals one 12 oz bottle of beer (355 mL), one 5 oz glass of wine (148 mL), or one 1 oz glass of hard liquor (44 mL). General instructions  Schedule regular health, dental, and eye exams.  Stay current with your vaccines.  Tell your health care provider if: ? You often feel depressed. ? You have ever been abused or do not feel safe at home. Summary  Adopting a healthy lifestyle and getting preventive care are important in promoting health and wellness.  Follow your health care provider's instructions about healthy  diet, exercising, and getting tested or screened for diseases.  Follow your health care provider's instructions on monitoring your cholesterol and blood pressure. This information is not intended to replace advice given to you by your health care provider. Make sure you discuss any questions you have with your health care provider. Document Revised: 05/31/2018 Document Reviewed: 05/31/2018 Elsevier Patient Education  2020 Elsevier Inc.  

## 2019-10-11 NOTE — Progress Notes (Signed)
   Virtual Visit via Telephone Note  I connected with Nancy Murray on 10/11/19 at  5:30 PM EDT by telephone and verified that I am speaking with the correct person using two identifiers.   I discussed the limitations, risks, security and privacy concerns of performing an evaluation and management service by telephone and the availability of in person appointments. I also discussed with the patient that there may be a patient responsible charge related to this service. The patient expressed understanding and agreed to proceed.   History of Present Illness: Nancy Murray  has a past medical history of Allergy, Blood transfusion without reported diagnosis, Clotting disorder (HCC), History of ITP (1986), Hyperlipidemia (07/04/2018), and Stroke (HCC) (03/18/2019). Patient states that she was taken off plavix by the neurosurgeon due to completion of course. Patient states that she now has medicaid and will be getting a new provider as this is her last visit with this clinic.    Observations/Objective: Patient with regular voice tone, rate and rhythm. Speaking calmly and is in no apparent distress.    Assessment and Plan: 1. Cerebrovascular accident (CVA), unspecified mechanism (HCC) - atorvastatin (LIPITOR) 40 MG tablet; TAKE ONE TABLET BY MOUTH EVERY DAY AT 6:00pm  Dispense: 90 tablet; Refill: 0  2. Mixed hyperlipidemia - atorvastatin (LIPITOR) 40 MG tablet; TAKE ONE TABLET BY MOUTH EVERY DAY AT 6:00pm  Dispense: 90 tablet; Refill: 0   Follow Up Instructions:  We discussed hand washing, using hand sanitizer when soap and water are not available, only going out when absolutely necessary, and social distancing. Explained to patient that she is immunocompromised and will need to take precautions during this time.   I discussed the assessment and treatment plan with the patient. The patient was provided an opportunity to ask questions and all were answered. The patient agreed with the plan and  demonstrated an understanding of the instructions.   The patient was advised to call back or seek an in-person evaluation if the symptoms worsen or if the condition fails to improve as anticipated.  I provided 10 minutes of non-face-to-face time during this encounter.  [name], FNP-BC Patient Care Center [facility] [address] [place], [place] [address] [phone]

## 2020-03-07 IMAGING — CT CT ANGIO HEAD
1 of 10 series · 6 of 34 positions shown · IV contrast (APPLIED)
Comparison: Head MRI 03/18/2019. Carotid Doppler ultrasound
03/18/2019.

CLINICAL DATA: Stroke follow-up. Right upper extremity numbness and
weakness. Acute left frontal and parietal infarcts on MRI.

EXAM:
CT ANGIOGRAPHY HEAD AND NECK
TECHNIQUE: Multidetector CT imaging of the head and neck was performed using
the standard protocol during bolus administration of intravenous
contrast. Multiplanar CT image reconstructions and MIPs were
obtained to evaluate the vascular anatomy. Carotid stenosis
measurements (when applicable) are obtained utilizing NASCET
criteria, using the distal internal carotid diameter as the
denominator.
CONTRAST:  75mL OMNIPAQUE IOHEXOL 350 MG/ML SOLN

[Series 11: ax thin · axial · 0.33mm/px · z∈[-335,-103]mm · 6 of 364 slices shown]
[im 52/364  soft-tissue]
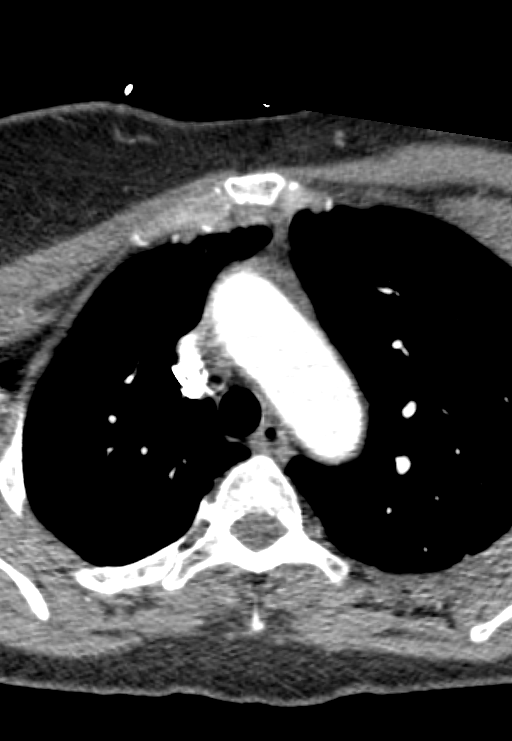
[im 104/364  bone]
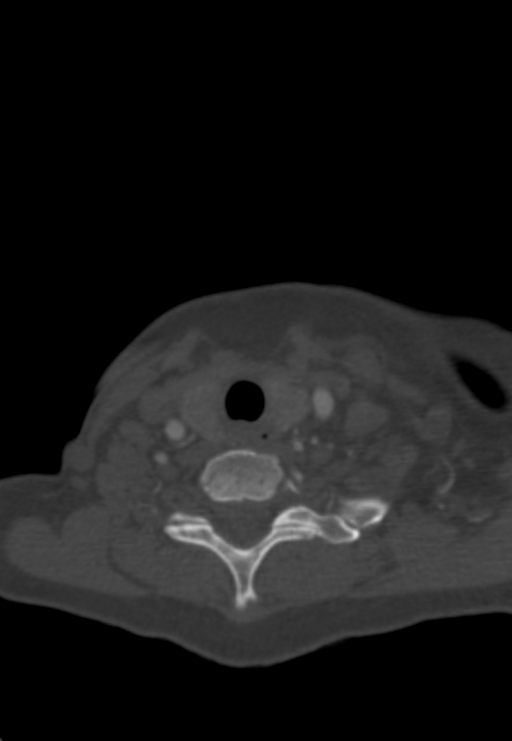
[im 156/364  soft-tissue]
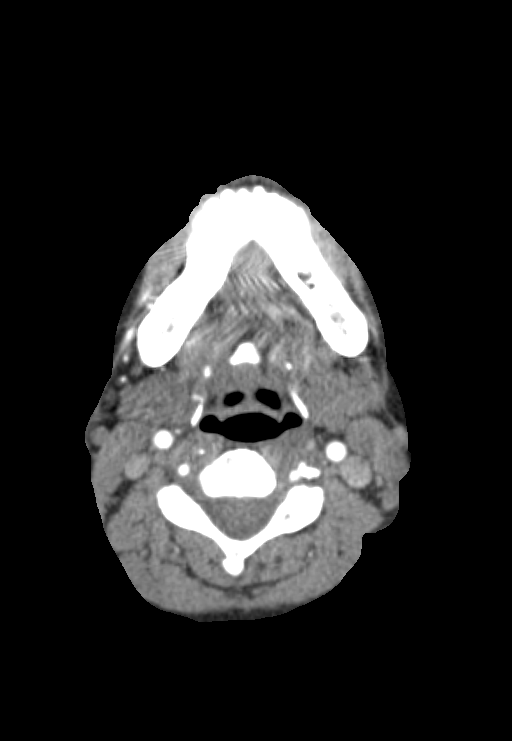
[im 208/364  bone]
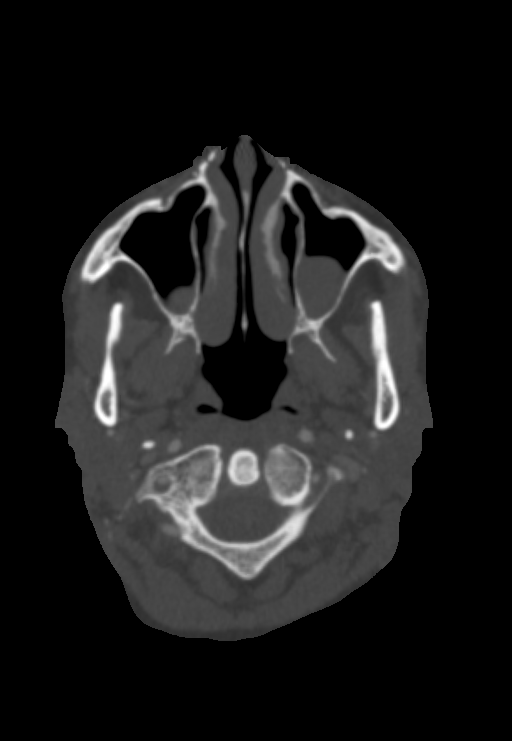
[im 260/364  soft-tissue]
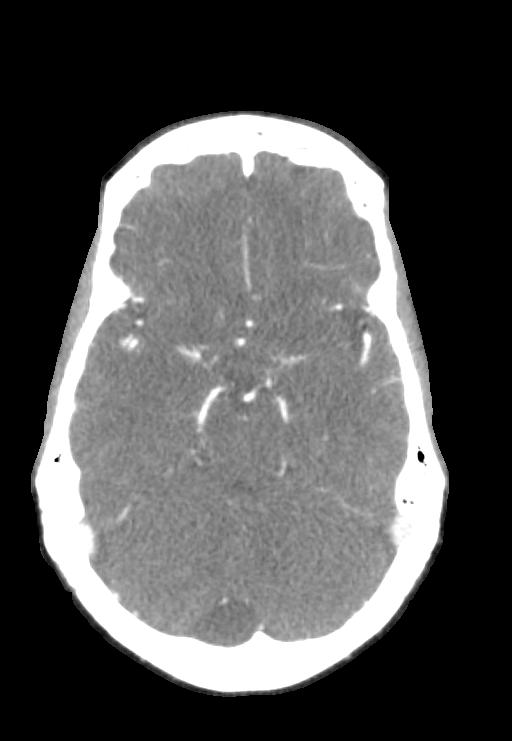
[im 312/364  bone]
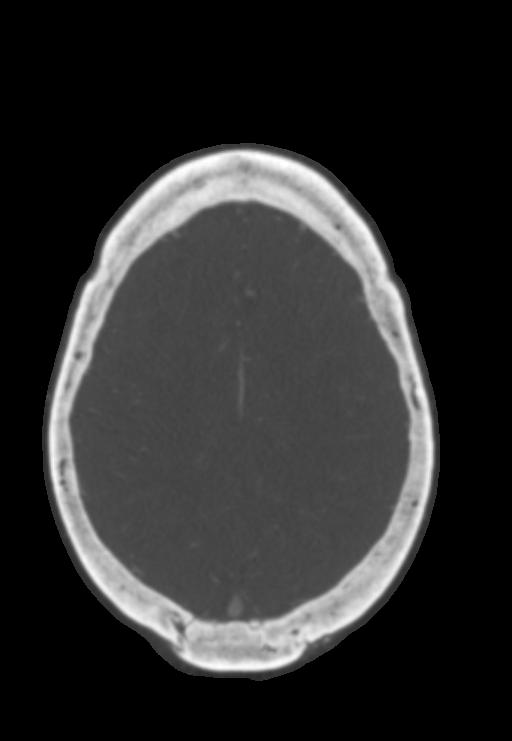

[6 of 34 positions shown; findings below may reference images not displayed]

FINDINGS: CT HEAD FINDINGS

Brain: Small areas of hypoattenuation involving left frontal and
parietal lobe cortex and white matter correspond to the acute
infarcts on MRI. No acute intracranial hemorrhage is apparent by CT.
No mass, midline shift, or extra-axial fluid collection is
identified. The ventricles and sulci are normal in size.

Vascular: Calcified atherosclerosis at the skull base.

Skull: No fracture or focal osseous lesion.

Sinuses: Polypoid mucosal thickening in both maxillary sinuses.
Clear mastoid air cells.

Orbits: Unremarkable.

Review of the MIP images confirms the above findings

CTA NECK FINDINGS

Aortic arch: Standard 3 vessel aortic arch. Widely patent arch
vessel origins.

Right carotid system: Patent without evidence of stenosis or
dissection.

Left carotid system: Patent with minimal calcified plaque in the
carotid bulb. No evidence of stenosis or dissection.

Vertebral arteries: Patent and codominant without evidence of
stenosis or dissection.

Skeleton: No acute osseous abnormality or suspicious osseous lesion.

Other neck: No evidence of cervical lymphadenopathy or mass.

Upper chest: No apical lung consolidation or mass.

Review of the MIP images confirms the above findings

CTA HEAD FINDINGS

Anterior circulation: The internal carotid arteries are widely
patent from skull base to carotid termini with minimal nonstenotic
plaque on the left. ACAs and MCAs are patent without evidence of
significant A1 or M1 stenosis. The right A1 segment is hypoplastic.
There is a severe stenosis of the proximal left M2 inferior
division. No aneurysm is identified.

Posterior circulation: The intracranial vertebral arteries are
widely patent to the basilar. Patent right PICA, bilateral AICA, and
bilateral SCA origins are visualized. A left PICA is not clearly
identified. The basilar artery is widely patent. There is a
medium-sized left posterior communicating artery. Both PCAs are
patent without evidence of significant proximal stenosis on the
right. They are moderate proximal left P2 stenoses. No aneurysm is
identified.

Venous sinuses: Patent.

Anatomic variants: Hypoplastic right A1.

Review of the MIP images confirms the above findings
IMPRESSION: 1. No large vessel occlusion.
2. Severe proximal left M2 stenosis.
3. Moderate proximal left P2 PCA stenoses.
4. Widely patent cervical carotid and vertebral arteries.

## 2020-03-20 ENCOUNTER — Other Ambulatory Visit: Payer: Self-pay

## 2020-03-20 DIAGNOSIS — E782 Mixed hyperlipidemia: Secondary | ICD-10-CM

## 2020-03-20 DIAGNOSIS — I639 Cerebral infarction, unspecified: Secondary | ICD-10-CM

## 2020-03-20 MED ORDER — ATORVASTATIN CALCIUM 40 MG PO TABS: ORAL_TABLET | ORAL | 0 refills | Status: AC

## 2021-08-27 LAB — COLOGUARD: COLOGUARD: NEGATIVE

## 2021-08-27 LAB — EXTERNAL GENERIC LAB PROCEDURE: COLOGUARD: NEGATIVE

## 2021-09-10 ENCOUNTER — Emergency Department
Admission: EM | Admit: 2021-09-10 | Discharge: 2021-09-10 | Disposition: A | Discharge status: Home / Self Care | Payer: Medicare Other | Attending: Emergency Medicine | Admitting: Emergency Medicine

## 2021-09-10 ENCOUNTER — Encounter: Payer: Self-pay | Admitting: Emergency Medicine

## 2021-09-10 ENCOUNTER — Other Ambulatory Visit: Payer: Self-pay

## 2021-09-10 ENCOUNTER — Emergency Department: Payer: Medicare Other

## 2021-09-10 DIAGNOSIS — R200 Anesthesia of skin: Secondary | ICD-10-CM | POA: Diagnosis present

## 2021-09-10 DIAGNOSIS — G5732 Lesion of lateral popliteal nerve, left lower limb: Secondary | ICD-10-CM | POA: Diagnosis not present

## 2021-09-10 DIAGNOSIS — R0602 Shortness of breath: Secondary | ICD-10-CM | POA: Insufficient documentation

## 2021-09-10 LAB — COMPREHENSIVE METABOLIC PANEL
ALT: 29 U/L (ref 0–44)
AST: 35 U/L (ref 15–41)
Albumin: 4.5 g/dL (ref 3.5–5.0)
Alkaline Phosphatase: 102 U/L (ref 38–126)
Anion gap: 8 (ref 5–15)
BUN: 29 mg/dL — ABNORMAL HIGH (ref 8–23)
CO2: 25 mmol/L (ref 22–32)
Calcium: 9.7 mg/dL (ref 8.9–10.3)
Chloride: 102 mmol/L (ref 98–111)
Creatinine, Ser: 0.95 mg/dL (ref 0.44–1.00)
GFR, Estimated: >60 mL/min (ref >60)
Glucose, Bld: 96 mg/dL (ref 70–99)
Potassium: 3.8 mmol/L (ref 3.5–5.1)
Sodium: 135 mmol/L (ref 135–145)
Total Bilirubin: 0.8 mg/dL (ref 0.3–1.2)
Total Protein: 7 g/dL (ref 6.5–8.1)

## 2021-09-10 LAB — CBC WITH DIFFERENTIAL/PLATELET
Abs Immature Granulocytes: 0.01 10*3/uL (ref 0.00–0.07)
Basophils Absolute: 0 10*3/uL (ref 0.0–0.1)
Basophils Relative: 1 %
Eosinophils Absolute: 0.2 10*3/uL (ref 0.0–0.5)
Eosinophils Relative: 7 %
HCT: 38.5 % (ref 36.0–46.0)
Hemoglobin: 12.4 g/dL (ref 12.0–15.0)
Immature Granulocytes: 0 %
Lymphocytes Relative: 40 %
Lymphs Abs: 1.4 10*3/uL (ref 0.7–4.0)
MCH: 27.8 pg (ref 26.0–34.0)
MCHC: 32.2 g/dL (ref 30.0–36.0)
MCV: 86.3 fL (ref 80.0–100.0)
Monocytes Absolute: 0.4 10*3/uL (ref 0.1–1.0)
Monocytes Relative: 11 %
Neutro Abs: 1.4 10*3/uL — ABNORMAL LOW (ref 1.7–7.7)
Neutrophils Relative %: 41 %
Platelets: 182 10*3/uL (ref 150–400)
RBC: 4.46 MIL/uL (ref 3.87–5.11)
RDW: 16.7 % — ABNORMAL HIGH (ref 11.5–15.5)
WBC: 3.4 10*3/uL — ABNORMAL LOW (ref 4.0–10.5)
nRBC: 0 % (ref 0.0–0.2)

## 2021-09-10 LAB — PROTIME-INR
INR: 1.1 (ref 0.8–1.2)
Prothrombin Time: 14 seconds (ref 11.4–15.2)

## 2021-09-10 LAB — ETHANOL: Alcohol, Ethyl (B): <10 mg/dL

## 2021-09-10 LAB — CBG MONITORING, ED: Glucose-Capillary: 85 mg/dL (ref 70–99)

## 2021-09-10 NOTE — ED Provider Notes (Signed)
? ?[facility] ?Provider Note ? ? Event Date/Time  ? First MD Initiated Contact with Patient 09/10/21 [phone]   ?  (approximate) ?History  ?Numbness ? ?HPI ?Nancy Murray is a 66 y.o. female with a history of previous CVA who presents for left lower extremity numbness.  Patient states that overnight last night she noticed an area of the left lateral lower leg from below the knee to the top of the foot was acutely numb.  Patient states that she has never had symptoms similar to this in the past.  Patient denies this numbness getting any worse and denies it spreading up or down the leg.  Patient states that the symptoms have been stable since onset.  Patient denies any headache, vision changes, facial droop, slurred speech, difficulty swallowing, or weakness/numbness/paresthesias in any other extremities ?Physical Exam  ?Triage Vital Signs: ?ED Triage Vitals  ?Enc Vitals Group  ?   BP 09/10/21 0627 135/86  ?   Pulse Rate 09/10/21 0627 62  ?   Resp 09/10/21 0730 13  ?   Temp 09/10/21 0627 98.2 ?F (36.8 ?C)  ?   Temp Source 09/10/21 0627 Oral  ?   SpO2 09/10/21 0627 100 %  ?   Weight 09/10/21 0627 138 lb (62.6 kg)  ?   Height 09/10/21 0627 5\' 5"  (1.651 m)  ?   Head Circumference --   ?   Peak Flow --   ?   Pain Score 09/10/21 0627 0  ?   Pain Loc --   ?   Pain Edu? --   ?   Excl. in [place]? --   ? ?Most recent vital signs: ?Vitals:  ? 09/10/21 0730 09/10/21 0802  ?BP: 137/78 120/90  ?Pulse: (!) 57 (!) 51  ?Resp: 13 19  ?Temp:    ?SpO2: 100% 100%  ? ?General: Awake, oriented x4. ?CV:  Good peripheral perfusion.  ?Resp:  Normal effort.  ?Abd:  No distention.  ?Other:  Elderly African-American female laying in bed in no distress.  Sensation decreased to the left lower extremity over peroneal nerve distribution.  Strength intact.  NIH stroke scale 0 ?ED Results / Procedures / Treatments  ?Labs ?(all labs ordered are listed, but only abnormal results are displayed) ?Labs Reviewed  ?COMPREHENSIVE METABOLIC  PANEL - Abnormal; Notable for the following components:  ?    Result Value  ? BUN 29 (*)   ? All other components within normal limits  ?CBC WITH DIFFERENTIAL/PLATELET - Abnormal; Notable for the following components:  ? WBC 3.4 (*)   ? RDW 16.7 (*)   ? Neutro Abs 1.4 (*)   ? All other components within normal limits  ?PROTIME-INR  ?ETHANOL  ?CBG MONITORING, ED  ? ?EKG ?ED ECG REPORT ?I, Naaman Plummer, the attending physician, personally viewed and interpreted this ECG. ?Date: 09/10/2021 ?EKG Time: 0655 ?Rate: 55 ?Rhythm: normal sinus rhythm ?QRS Axis: normal ?Intervals: normal ?ST/T Wave abnormalities: normal ?Narrative Interpretation: no evidence of acute ischemia ?RADIOLOGY ?ED MD interpretation: CT of the head without contrast interpreted by me shows no evidence of acute abnormalities including no intracerebral hemorrhage, obvious masses, or significant edema ?-Agree with radiology assessment ?Official radiology report(s): ?CT HEAD WO CONTRAST ? ?Result Date: 09/10/2021 ?CLINICAL DATA:  Right leg numbness starting today at 5:30 a.m. EXAM: CT HEAD WITHOUT CONTRAST TECHNIQUE: Contiguous axial images were obtained from the base of the skull through the vertex without intravenous contrast. RADIATION DOSE REDUCTION: This exam was performed  according to the departmental dose-optimization program which includes automated exposure control, adjustment of the mA and/or kV according to patient size and/or use of iterative reconstruction technique. COMPARISON:  03/19/2019 FINDINGS: Brain: No evidence of acute infarction, hemorrhage, hydrocephalus, extra-axial collection or mass lesion/mass effect. Areas of prior left cerebral infarction by MRI are occult. Mega cisterna magna. Vascular: No hyperdense vessel or unexpected calcification. Skull: Normal. Negative for fracture or focal lesion. Sinuses/Orbits: No acute finding. IMPRESSION: No acute or interval finding. Remote left cerebral infarcts by prior MRI. Electronically  Signed   By: Jorje Guild M.D.   On: 09/10/2021 07:29   ?PROCEDURES: ?Critical Care performed: No ?.1-3 Lead EKG Interpretation ?Performed by: Naaman Plummer, MD ?Authorized by: Naaman Plummer, MD  ? ?  Interpretation: abnormal   ?  ECG rate:  52 ?  ECG rate assessment: bradycardic   ?  Rhythm: sinus bradycardia   ?  Ectopy: none   ?  Conduction: normal   ?MEDICATIONS ORDERED IN ED: ?Medications - No data to display ?IMPRESSION / MDM / ASSESSMENT AND PLAN / ED COURSE  ?I reviewed the triage vital signs and the nursing notes. ?             ?               ?The patient is on the cardiac monitor to evaluate for evidence of arrhythmia and/or significant heart rate changes. ?Presents with sensation of numbness to the left lower extremity from the knee to the top of the foot on the lateral aspect. ? ?Patient?s symptoms and work-up not consistent with a stroke and therefore they will be discharged from the ED.  Patient has currently been stabilized in the emergency department. ? ?Patient received an head CT that was negative for stroke. ? ?Patient?s symptoms not typical for other emergent causes such as dissection, infection, DKA, trauma.  Given peroneal nerve distribution of this numbness, suspect common/superficial peroneal nerve injury.  Patient provided with neurology follow-up if numbness does not improve in the next 5-7 days.   ?Patient will be discharged with strict return precautions and follow up with primary MD within 24 hours for further evaluation. ? ?Dispo: Discharge home with neurology follow-up ? ?  ?FINAL CLINICAL IMPRESSION(S) / ED DIAGNOSES  ? ?Final diagnoses:  ?Peroneal nerve palsy, left  ? ?Rx / DC Orders  ? ?ED Discharge Orders   ? ? None  ? ?  ? ?Note:  This document was prepared using Dragon voice recognition software and may include unintentional dictation errors. ?  ?Naaman Plummer, MD ?09/10/21 [phone] ? ?

## 2021-09-10 NOTE — ED Triage Notes (Addendum)
Patient ambulatory to triage with steady gait, without difficulty or distress noted; pt reports at 520am noted onset of numbness to left leg; denies weakness to any extremities or any other c/o, st hx CVA with no deficits ?

## 2021-09-10 NOTE — ED Notes (Signed)
Presents to the ED via POV with her husband for c/o R shin numbness. Stated that she got up to go to the bathroom this morning, when she got back in the bed and pulled sheets across her leg the sensation was different. Previous hx of stroke with s/s of R hand numbness and slurred speech. Hx of ITP. No complaints of pain. A&Ox4. Skin p/w/d. RR even and nonlabored.  ?

## 2021-09-10 NOTE — ED Provider Notes (Signed)
Emergency Medicine Provider Triage Evaluation Note ? ?Nancy Murray , a 66 y.o. female  was evaluated upon arrival to exam room.  Pt complains of numbness to her left anterior lower leg.  This was acute in onset within about the last hour.  No other neurological deficits or complaints.  No pain. ? ?Review of Systems  ?Positive: Left anterior lower leg numbness. ?Negative: Weakness, dysphagia, aphasia, dysarthria, headache, chest pain. ? ?Physical Exam  ?BP 135/86 (BP Location: Right Arm)   Pulse 62   Temp 98.2 ?F (36.8 ?C) (Oral)   Ht 1.651 m (5\' 5" )   Wt 62.6 kg   LMP 07/04/2005   SpO2 100%   BMI 22.96 kg/m?  ?Gen:   Awake, no distress   ?Resp:  Normal effort  ?MSK:   Moves extremities without difficulty  ?Other:  NIH stroke scale of 1 for subjective light touch sensation deficit in left anterior lower leg.  Otherwise neurologically intact.  No visible cranial nerve deficits, good major muscle group strength, no speech difficulties. ? ?Medical Decision Making  ?Medically screening exam initiated at 6:38 AM.  Appropriate orders placed.  Chandni Tilghman was informed that the remainder of the evaluation will be completed by another provider, this initial triage assessment does not replace that evaluation, and the importance of remaining in the ED until their evaluation is complete. ? ?Highly doubt acute CVA but will proceed with lab work-up and stat head CT.  I am not calling a code stroke at this time. ?  ?Hinda Kehr, MD ?09/10/21 [phone] ? ?

## 2023-05-13 ENCOUNTER — Other Ambulatory Visit: Payer: Self-pay | Admitting: Internal Medicine

## 2023-05-13 DIAGNOSIS — Z1231 Encounter for screening mammogram for malignant neoplasm of breast: Secondary | ICD-10-CM

## 2023-09-07 ENCOUNTER — Ambulatory Visit (INDEPENDENT_AMBULATORY_CARE_PROVIDER_SITE_OTHER)

## 2023-09-07 DIAGNOSIS — K640 First degree hemorrhoids: Secondary | ICD-10-CM | POA: Diagnosis not present

## 2023-09-07 DIAGNOSIS — K635 Polyp of colon: Secondary | ICD-10-CM | POA: Diagnosis not present

## 2023-09-07 DIAGNOSIS — D120 Benign neoplasm of cecum: Secondary | ICD-10-CM | POA: Diagnosis present
